# Patient Record
Sex: Male | Born: 1965 | Race: White | Hispanic: No | Marital: Married | Smoking: Never smoker
Health system: Southern US, Community
[De-identification: ages and names within clinical notes are randomized; demographics above are authoritative.]

## PROBLEM LIST (undated history)

## (undated) DIAGNOSIS — K219 Gastro-esophageal reflux disease without esophagitis: Secondary | ICD-10-CM

## (undated) DIAGNOSIS — I10 Essential (primary) hypertension: Secondary | ICD-10-CM

## (undated) DIAGNOSIS — E7800 Pure hypercholesterolemia, unspecified: Secondary | ICD-10-CM

## (undated) HISTORY — PX: SHOULDER SURGERY: SHX246

## (undated) HISTORY — PX: TONSILLECTOMY: SUR1361

---

## 1999-09-04 ENCOUNTER — Ambulatory Visit (HOSPITAL_COMMUNITY): Admission: RE | Admit: 1999-09-04 | Discharge: 1999-09-04 | Payer: Self-pay | Admitting: Emergency Medicine

## 1999-09-04 ENCOUNTER — Encounter: Payer: Self-pay | Admitting: Emergency Medicine

## 1999-09-25 ENCOUNTER — Encounter: Payer: Self-pay | Admitting: Emergency Medicine

## 1999-09-25 ENCOUNTER — Ambulatory Visit (HOSPITAL_COMMUNITY): Admission: RE | Admit: 1999-09-25 | Discharge: 1999-09-25 | Payer: Self-pay | Admitting: Oncology

## 2012-07-22 DIAGNOSIS — K219 Gastro-esophageal reflux disease without esophagitis: Secondary | ICD-10-CM | POA: Diagnosis present

## 2012-07-22 DIAGNOSIS — E119 Type 2 diabetes mellitus without complications: Secondary | ICD-10-CM

## 2013-02-09 ENCOUNTER — Other Ambulatory Visit: Payer: Self-pay | Admitting: Family Medicine

## 2013-02-09 ENCOUNTER — Ambulatory Visit
Admission: RE | Admit: 2013-02-09 | Discharge: 2013-02-09 | Disposition: A | Discharge status: Home / Self Care | Payer: Worker's Compensation | Source: Ambulatory Visit | Attending: Family Medicine | Admitting: Family Medicine

## 2013-02-09 DIAGNOSIS — R52 Pain, unspecified: Secondary | ICD-10-CM

## 2015-02-18 DIAGNOSIS — R7301 Impaired fasting glucose: Secondary | ICD-10-CM | POA: Insufficient documentation

## 2015-02-18 DIAGNOSIS — IMO0002 Reserved for concepts with insufficient information to code with codable children: Secondary | ICD-10-CM | POA: Insufficient documentation

## 2015-02-18 DIAGNOSIS — K2100 Gastro-esophageal reflux disease with esophagitis, without bleeding: Secondary | ICD-10-CM | POA: Diagnosis present

## 2015-02-18 DIAGNOSIS — N182 Chronic kidney disease, stage 2 (mild): Secondary | ICD-10-CM | POA: Insufficient documentation

## 2015-02-18 DIAGNOSIS — I10 Essential (primary) hypertension: Secondary | ICD-10-CM | POA: Diagnosis present

## 2015-02-18 DIAGNOSIS — E782 Mixed hyperlipidemia: Secondary | ICD-10-CM | POA: Insufficient documentation

## 2015-02-18 DIAGNOSIS — R7401 Elevation of levels of liver transaminase levels: Secondary | ICD-10-CM | POA: Insufficient documentation

## 2016-11-25 DIAGNOSIS — K760 Fatty (change of) liver, not elsewhere classified: Secondary | ICD-10-CM | POA: Diagnosis present

## 2018-02-20 DIAGNOSIS — M1711 Unilateral primary osteoarthritis, right knee: Secondary | ICD-10-CM | POA: Insufficient documentation

## 2019-02-27 DIAGNOSIS — E6609 Other obesity due to excess calories: Secondary | ICD-10-CM | POA: Insufficient documentation

## 2019-02-27 DIAGNOSIS — Z6831 Body mass index (BMI) 31.0-31.9, adult: Secondary | ICD-10-CM | POA: Insufficient documentation

## 2020-05-12 ENCOUNTER — Encounter (HOSPITAL_BASED_OUTPATIENT_CLINIC_OR_DEPARTMENT_OTHER): Payer: Self-pay | Admitting: *Deleted

## 2020-05-12 ENCOUNTER — Emergency Department (HOSPITAL_BASED_OUTPATIENT_CLINIC_OR_DEPARTMENT_OTHER): Payer: No Typology Code available for payment source

## 2020-05-12 ENCOUNTER — Inpatient Hospital Stay (HOSPITAL_BASED_OUTPATIENT_CLINIC_OR_DEPARTMENT_OTHER)
Admission: EM | Admit: 2020-05-12 | Discharge: 2020-05-22 | DRG: 501 | Disposition: A | Discharge status: Rehabilitation Facility | Payer: No Typology Code available for payment source | Attending: Orthopedic Surgery | Admitting: Orthopedic Surgery

## 2020-05-12 ENCOUNTER — Other Ambulatory Visit: Payer: Self-pay

## 2020-05-12 DIAGNOSIS — D72829 Elevated white blood cell count, unspecified: Secondary | ICD-10-CM

## 2020-05-12 DIAGNOSIS — E7800 Pure hypercholesterolemia, unspecified: Secondary | ICD-10-CM | POA: Diagnosis present

## 2020-05-12 DIAGNOSIS — S76112A Strain of left quadriceps muscle, fascia and tendon, initial encounter: Secondary | ICD-10-CM | POA: Diagnosis not present

## 2020-05-12 DIAGNOSIS — S76111A Strain of right quadriceps muscle, fascia and tendon, initial encounter: Secondary | ICD-10-CM

## 2020-05-12 DIAGNOSIS — S86819A Strain of other muscle(s) and tendon(s) at lower leg level, unspecified leg, initial encounter: Secondary | ICD-10-CM | POA: Diagnosis present

## 2020-05-12 DIAGNOSIS — Z20822 Contact with and (suspected) exposure to covid-19: Secondary | ICD-10-CM | POA: Diagnosis present

## 2020-05-12 DIAGNOSIS — K2100 Gastro-esophageal reflux disease with esophagitis, without bleeding: Secondary | ICD-10-CM | POA: Diagnosis present

## 2020-05-12 DIAGNOSIS — E871 Hypo-osmolality and hyponatremia: Secondary | ICD-10-CM | POA: Diagnosis not present

## 2020-05-12 DIAGNOSIS — K760 Fatty (change of) liver, not elsewhere classified: Secondary | ICD-10-CM | POA: Diagnosis present

## 2020-05-12 DIAGNOSIS — Z79899 Other long term (current) drug therapy: Secondary | ICD-10-CM

## 2020-05-12 DIAGNOSIS — S86812A Strain of other muscle(s) and tendon(s) at lower leg level, left leg, initial encounter: Secondary | ICD-10-CM

## 2020-05-12 DIAGNOSIS — E119 Type 2 diabetes mellitus without complications: Secondary | ICD-10-CM | POA: Diagnosis present

## 2020-05-12 DIAGNOSIS — M25561 Pain in right knee: Secondary | ICD-10-CM | POA: Diagnosis present

## 2020-05-12 DIAGNOSIS — Z7982 Long term (current) use of aspirin: Secondary | ICD-10-CM

## 2020-05-12 DIAGNOSIS — W19XXXA Unspecified fall, initial encounter: Secondary | ICD-10-CM

## 2020-05-12 DIAGNOSIS — K219 Gastro-esophageal reflux disease without esophagitis: Secondary | ICD-10-CM | POA: Diagnosis present

## 2020-05-12 DIAGNOSIS — I10 Essential (primary) hypertension: Secondary | ICD-10-CM | POA: Diagnosis present

## 2020-05-12 HISTORY — DX: Gastro-esophageal reflux disease without esophagitis: K21.9

## 2020-05-12 HISTORY — DX: Essential (primary) hypertension: I10

## 2020-05-12 HISTORY — DX: Pure hypercholesterolemia, unspecified: E78.00

## 2020-05-12 LAB — BASIC METABOLIC PANEL
Anion gap: 11 (ref 5–15)
BUN: 22 mg/dL — ABNORMAL HIGH (ref 6–20)
CO2: 22 mmol/L (ref 22–32)
Calcium: 8.9 mg/dL (ref 8.9–10.3)
Chloride: 97 mmol/L — ABNORMAL LOW (ref 98–111)
Creatinine, Ser: 0.89 mg/dL (ref 0.61–1.24)
GFR, Estimated: >60 mL/min (ref >60)
Glucose, Bld: 184 mg/dL — ABNORMAL HIGH (ref 70–99)
Potassium: 4 mmol/L (ref 3.5–5.1)
Sodium: 130 mmol/L — ABNORMAL LOW (ref 135–145)

## 2020-05-12 LAB — RESP PANEL BY RT-PCR (FLU A&B, COVID) ARPGX2
Influenza A by PCR: NEGATIVE
Influenza B by PCR: NEGATIVE
SARS Coronavirus 2 by RT PCR: NEGATIVE

## 2020-05-12 LAB — CBC WITH DIFFERENTIAL/PLATELET
Abs Immature Granulocytes: 0.11 10*3/uL — ABNORMAL HIGH (ref 0.00–0.07)
Basophils Absolute: 0.1 10*3/uL (ref 0.0–0.1)
Basophils Relative: 1 %
Eosinophils Absolute: 0.1 10*3/uL (ref 0.0–0.5)
Eosinophils Relative: 1 %
HCT: 42.9 % (ref 39.0–52.0)
Hemoglobin: 14.8 g/dL (ref 13.0–17.0)
Immature Granulocytes: 1 %
Lymphocytes Relative: 15 %
Lymphs Abs: 2.3 10*3/uL (ref 0.7–4.0)
MCH: 30.1 pg (ref 26.0–34.0)
MCHC: 34.5 g/dL (ref 30.0–36.0)
MCV: 87.2 fL (ref 80.0–100.0)
Monocytes Absolute: 0.8 10*3/uL (ref 0.1–1.0)
Monocytes Relative: 5 %
Neutro Abs: 12.1 10*3/uL — ABNORMAL HIGH (ref 1.7–7.7)
Neutrophils Relative %: 77 %
Platelets: 289 10*3/uL (ref 150–400)
RBC: 4.92 MIL/uL (ref 4.22–5.81)
RDW: 12.4 % (ref 11.5–15.5)
WBC: 15.4 10*3/uL — ABNORMAL HIGH (ref 4.0–10.5)
nRBC: 0 % (ref 0.0–0.2)

## 2020-05-12 MED ORDER — PANTOPRAZOLE SODIUM 40 MG PO TBEC: 40.0000 mg | DELAYED_RELEASE_TABLET | Freq: Every day | ORAL | Status: DC

## 2020-05-12 MED ORDER — SIMVASTATIN 20 MG PO TABS
20.0000 mg | ORAL_TABLET | Freq: Every day | ORAL | Status: DC
  Administered 2020-05-14 – 2020-05-21 (×8): 20 mg via ORAL
  Filled 2020-05-12 (×9): qty 1

## 2020-05-12 MED ORDER — IRBESARTAN 150 MG PO TABS
150.0000 mg | ORAL_TABLET | Freq: Every day | ORAL | Status: DC
  Administered 2020-05-14 – 2020-05-22 (×9): 150 mg via ORAL
  Filled 2020-05-12 (×9): qty 1

## 2020-05-12 MED ORDER — HYDROMORPHONE HCL 1 MG/ML IJ SOLN
1.0000 mg | INTRAMUSCULAR | Status: DC | PRN
  Filled 2020-05-12: qty 1

## 2020-05-12 MED ORDER — OXYCODONE-ACETAMINOPHEN 5-325 MG PO TABS
2.0000 | ORAL_TABLET | ORAL | Status: DC | PRN
  Administered 2020-05-12 – 2020-05-13 (×2): 2 via ORAL
  Filled 2020-05-12 (×2): qty 2

## 2020-05-12 MED ORDER — HYDROMORPHONE HCL 1 MG/ML IJ SOLN
1.0000 mg | Freq: Once | INTRAMUSCULAR | Status: AC
  Administered 2020-05-12: 1 mg via INTRAVENOUS
  Filled 2020-05-12: qty 1

## 2020-05-12 NOTE — ED Notes (Signed)
Pt was did not tolerate moving left knee at all, to get into knee immobilizer, even with pain medicine on board. Notified MD and strapped Ice packs to left knee.  Did tolerate immobilizing the right knee.

## 2020-05-12 NOTE — ED Provider Notes (Signed)
[facility] EMERGENCY DEPARTMENT Provider Note   CSN: [id number] Arrival date & time: 05/12/20  1912     History Chief Complaint  Patient presents with  . Knee Pain    Bilat L and R    Arthur Ayers is a 55 y.o. male.  He does not have any significant medical history.  He was at work for [employer] when he slipped getting out of his truck.  Knee buckled and when he fell he hit his right knee under the truck.  Now the both knees are swollen and tender and cannot support him.  He called some coworkers who picked him up and put them in a car and brought him here.  He was not able to ambulate into the department.  Denies any other injuries no loss of consciousness.  He is not on any blood thinners.  No distal numbness or weakness.  The history is provided by the patient.  Knee Pain Location:  Knee Injury: yes   Mechanism of injury: fall   Fall:    Point of impact:  Knees Knee location:  L knee and R knee Pain details:    Quality:  Throbbing   Severity:  Severe   Onset quality:  Sudden   Timing:  Constant   Progression:  Unchanged Chronicity:  New Associated symptoms: swelling   Associated symptoms: no back pain, no fever, no neck pain and no numbness        Past Medical History:  Diagnosis Date  . GERD (gastroesophageal reflux disease)   . Hypercholesteremia   . Hypertension     There are no problems to display for this patient.   Past Surgical History:  Procedure Laterality Date  . SHOULDER SURGERY     Rotator Cuff Repair in 2015 R shoulder  . TONSILLECTOMY         No family history on file.     Home Medications Prior to Admission medications   Medication Sig Start Date End Date Taking? Authorizing Provider  aspirin 325 MG EC tablet Take 325 mg by mouth.   Yes [provider]  Boswellia-Glucosamine-Vit D (OSTEO BI-FLEX-GLUCOS/5-LOXIN) TABS Take 1 tablet by mouth every morning.   Yes [provider]  esomeprazole (NEXIUM) 40  MG capsule Take 40 mg by mouth daily. 02/18/20  Yes [provider]  Multiple Vitamin (MULTIVITAMIN) tablet Take 1 tablet by mouth every morning.   Yes [provider]  simvastatin (ZOCOR) 20 MG tablet  02/27/19  Yes [provider]  telmisartan (MICARDIS) 40 MG tablet  02/27/19  Yes [provider]    Allergies    Patient has no known allergies.  Review of Systems   Review of Systems  Constitutional: Negative for fever.  HENT: Negative for sore throat.   Eyes: Negative for visual disturbance.  Respiratory: Negative for shortness of breath.   Cardiovascular: Negative for chest pain.  Gastrointestinal: Negative for abdominal pain.  Genitourinary: Negative for dysuria.  Musculoskeletal: Positive for gait problem. Negative for back pain and neck pain.  Skin: Negative for rash.  Neurological: Negative for headaches.    Physical Exam Updated Vital Signs BP 132/80 (BP Location: Left Arm)   Pulse 87   Temp 98.7 F (37.1 C) (Oral)   Resp 18   Ht 5\' 9"  (1.753 m)   Wt 99.8 kg   SpO2 98%   BMI 32.49 kg/m   Physical Exam Vitals and nursing note reviewed.  Constitutional:  Appearance: Normal appearance. He is well-developed.  HENT:     Head: Normocephalic and atraumatic.  Eyes:     Conjunctiva/sclera: Conjunctivae normal.  Cardiovascular:     Rate and Rhythm: Normal rate and regular rhythm.     Heart sounds: No murmur heard.   Pulmonary:     Effort: Pulmonary effort is normal. No respiratory distress.     Breath sounds: Normal breath sounds.  Abdominal:     Palpations: Abdomen is soft.     Tenderness: There is no abdominal tenderness.  Musculoskeletal:        General: Swelling, tenderness and signs of injury present.     Cervical back: Neck supple.     Comments: Right knee patella seems normally located, nontender joint line.  He is tender and swollen superior to the kneecap.  His extensor mechanism is minimally intact and he can raise  his heel off the bed about an inch.  Distal neurovascular intact.  Left knee.  Patella seems to be slightly offset laterally and superior.  Joint line nontender.  Unable to raise his heel off the bed to assess extensor mechanism.  Distal neurovascular intact.  Skin:    General: Skin is warm and dry.  Neurological:     General: No focal deficit present.     Mental Status: He is alert.     ED Results / Procedures / Treatments   Labs (all labs ordered are listed, but only abnormal results are displayed) Labs Reviewed  BASIC METABOLIC PANEL - Abnormal; Notable for the following components:      Result Value   Sodium 130 (*)    Chloride 97 (*)    Glucose, Bld 184 (*)    BUN 22 (*)    All other components within normal limits  CBC WITH DIFFERENTIAL/PLATELET - Abnormal; Notable for the following components:   WBC 15.4 (*)    Neutro Abs 12.1 (*)    Abs Immature Granulocytes 0.11 (*)    All other components within normal limits  URINALYSIS, ROUTINE W REFLEX MICROSCOPIC - Abnormal; Notable for the following components:   Ketones, ur 5 (*)    All other components within normal limits  HEMOGLOBIN A1C - Abnormal; Notable for the following components:   Hgb A1c MFr Bld 7.1 (*)    All other components within normal limits  RESP PANEL BY RT-PCR (FLU A&B, COVID) ARPGX2  URINE CULTURE  BASIC METABOLIC PANEL  CBC WITH DIFFERENTIAL/PLATELET    EKG None  Radiology DG Knee 1-2 Views Left  Result Date: 05/12/2020 CLINICAL DATA:  Fall.  Left knee swelling. EXAM: LEFT KNEE - 1-2 VIEW COMPARISON:  None. FINDINGS: There is patellar [place]. There is an osseous fragment displaced 2.8 cm superior to the superior patellar pole that may represent a displaced enthesophyte or patellar fracture pregnant. Large amount of soft tissue edema in the region of the quadriceps tendon most consistent with quadriceps tendon rupture. There is a joint effusion. No fracture of the femur, tibia, or fibula. Tibiofemoral  alignment is normal. IMPRESSION: 1. Findings consistent with quadriceps tendon rupture. 2. Displacement of an osseous fragment 2.8 cm superior to the superior patellar pole, possibly displaced enthesophyte or patellar fracture. 3. Joint effusion. Electronically Signed   By: Narda RutherfordMelanie  Sanford M.D.   On: 05/12/2020 20:17   DG Knee 1-2 Views Right  Result Date: 05/12/2020 CLINICAL DATA:  Right knee pain after fall. EXAM: RIGHT KNEE - 1-2 VIEW COMPARISON:  None. FINDINGS: Mild patellar Baja. Large amount  of soft tissue edema in the suprapatellar region. There is a 6 mm osseous fragment displaced 2.4 cm proximal to the superior patella. Joint effusion. No fracture of the femur, proximal tibia or fibula. Tibiofemoral alignment is maintained. IMPRESSION: 1. Displaced 6 mm osseous fragment adjacent to the superior patella with associated soft tissue edema and mild patellar Baja. Findings are suggestive of quadriceps tendon injury/rupture. 2. Joint effusion. Electronically Signed   By: Narda Rutherford M.D.   On: 05/12/2020 20:24    Procedures Procedures   Medications Ordered in ED Medications  HYDROmorphone (DILAUDID) injection 1 mg (has no administration in time range)  oxyCODONE-acetaminophen (PERCOCET/ROXICET) 5-325 MG per tablet 2 tablet (2 tablets Oral Given 05/13/20 0405)  pantoprazole (PROTONIX) EC tablet 40 mg (40 mg Oral Not Given 05/13/20 1005)  simvastatin (ZOCOR) tablet 20 mg (has no administration in time range)  irbesartan (AVAPRO) tablet 150 mg (150 mg Oral Not Given 05/13/20 1005)  0.9 % NaCl with KCl 20 mEq/ L  infusion ( Intravenous New Bag/Given 05/13/20 0356)  enoxaparin (LOVENOX) injection 40 mg (40 mg Subcutaneous Not Given 05/13/20 1008)  HYDROmorphone (DILAUDID) injection 1 mg (1 mg Intravenous Given 05/12/20 2111)    ED Course  I have reviewed the triage vital signs and the nursing notes.  Pertinent labs & imaging results that were available during my care of the patient were  reviewed by me and considered in my medical decision making (see chart for details).  Clinical Course as of 05/13/20 1008  Mon May 12, 2020  2134 Discussed with Dr. Eulah Pont orthopedics.  He said to try to see if we can mobilize him with bilateral knee immobilizers and a walker but if he is unsafe for discharge to admit him to his service at [employer] [employer].  Order MRI bilateral knee.  Anticipate for surgery on Thursday. [MB]  2134 Patient unable to tolerate getting into the knee immobilizers.  I have put in temporary admission orders to [facility] under Dr. Greig Right service.  I have ordered the MRI and have ordered some pain medicine here along with the patient's home meds. [MB]    Clinical Course User Index [MB] Terrilee Files, MD   MDM Rules/Calculators/A&P                         This patient complains of bilateral knee pain after a fall; this involves an extensive number of treatment Options and is a complaint that carries with it a high risk of complications and Morbidity. The differential includes fracture, dislocation, extensor mechanism disruption, meniscal injury, vascular injury  I ordered, reviewed and interpreted labs, which included CBC with elevated white count likely reactive, normal hemoglobin, chemistries fairly normal other than elevated glucose, Covid testing negative I ordered medication IV pain medication I ordered imaging studies which included x-rays bilateral knees and I independently    visualized and interpreted imaging which showed patellar fractures and likely extensor mechanism disruption Previous records obtained and reviewed in epic, no recent findings I consulted Dr. Eulah Pont orthopedics and discussed lab and imaging findings  Critical Interventions: None  After the interventions stated above, I reevaluated the patient and found patient's pain to be adequately controlled.  He is unable to ambulate safely in the department.  He will be admitted and transferred to [facility]  hospital for further orthopedic evaluation and likely operative repair.  Patient in agreement with plan.   Final Clinical Impression(s) / ED Diagnoses Final diagnoses:  Rupture of  left patellar tendon, initial encounter  Rupture of right quadriceps muscle, initial encounter    Rx / DC Orders ED Discharge Orders    None       Terrilee Files, MD 05/13/20 1013

## 2020-05-12 NOTE — ED Triage Notes (Signed)
Pt. Reports he stepped down out of his [employer] truck the L foot slipped and his L knee popped out of joint per Pt. And the R knee hit the truck beside of him he believes and now it is swollen per Pt. And he can not apply any pressure to the R leg.  He can straighten the R leg but apply no pressure.  Pt. Reports he can not even straighten the L leg.

## 2020-05-13 ENCOUNTER — Inpatient Hospital Stay (HOSPITAL_COMMUNITY): Payer: No Typology Code available for payment source | Admitting: Anesthesiology

## 2020-05-13 ENCOUNTER — Inpatient Hospital Stay (HOSPITAL_COMMUNITY): Payer: No Typology Code available for payment source

## 2020-05-13 ENCOUNTER — Encounter (HOSPITAL_COMMUNITY): Payer: Self-pay | Admitting: Orthopedic Surgery

## 2020-05-13 ENCOUNTER — Encounter (HOSPITAL_COMMUNITY)
Admission: EM | Disposition: A | Discharge status: Rehabilitation Facility | Payer: Self-pay | Source: Home / Self Care | Attending: Orthopedic Surgery

## 2020-05-13 DIAGNOSIS — Z7982 Long term (current) use of aspirin: Secondary | ICD-10-CM | POA: Diagnosis not present

## 2020-05-13 DIAGNOSIS — S76112A Strain of left quadriceps muscle, fascia and tendon, initial encounter: Secondary | ICD-10-CM | POA: Diagnosis present

## 2020-05-13 DIAGNOSIS — K760 Fatty (change of) liver, not elsewhere classified: Secondary | ICD-10-CM | POA: Diagnosis present

## 2020-05-13 DIAGNOSIS — K2100 Gastro-esophageal reflux disease with esophagitis, without bleeding: Secondary | ICD-10-CM | POA: Diagnosis present

## 2020-05-13 DIAGNOSIS — M25561 Pain in right knee: Secondary | ICD-10-CM

## 2020-05-13 DIAGNOSIS — R7401 Elevation of levels of liver transaminase levels: Secondary | ICD-10-CM | POA: Diagnosis not present

## 2020-05-13 DIAGNOSIS — E119 Type 2 diabetes mellitus without complications: Secondary | ICD-10-CM | POA: Diagnosis present

## 2020-05-13 DIAGNOSIS — E871 Hypo-osmolality and hyponatremia: Secondary | ICD-10-CM | POA: Diagnosis not present

## 2020-05-13 DIAGNOSIS — S76112S Strain of left quadriceps muscle, fascia and tendon, sequela: Secondary | ICD-10-CM | POA: Diagnosis not present

## 2020-05-13 DIAGNOSIS — S86819A Strain of other muscle(s) and tendon(s) at lower leg level, unspecified leg, initial encounter: Secondary | ICD-10-CM | POA: Diagnosis present

## 2020-05-13 DIAGNOSIS — S76111A Strain of right quadriceps muscle, fascia and tendon, initial encounter: Secondary | ICD-10-CM | POA: Diagnosis present

## 2020-05-13 DIAGNOSIS — E7800 Pure hypercholesterolemia, unspecified: Secondary | ICD-10-CM | POA: Diagnosis present

## 2020-05-13 DIAGNOSIS — I10 Essential (primary) hypertension: Secondary | ICD-10-CM | POA: Diagnosis present

## 2020-05-13 DIAGNOSIS — W19XXXA Unspecified fall, initial encounter: Secondary | ICD-10-CM | POA: Diagnosis present

## 2020-05-13 DIAGNOSIS — Z79899 Other long term (current) drug therapy: Secondary | ICD-10-CM | POA: Diagnosis not present

## 2020-05-13 DIAGNOSIS — Z20822 Contact with and (suspected) exposure to covid-19: Secondary | ICD-10-CM | POA: Diagnosis present

## 2020-05-13 DIAGNOSIS — D72829 Elevated white blood cell count, unspecified: Secondary | ICD-10-CM | POA: Diagnosis not present

## 2020-05-13 DIAGNOSIS — S86819S Strain of other muscle(s) and tendon(s) at lower leg level, unspecified leg, sequela: Secondary | ICD-10-CM | POA: Diagnosis not present

## 2020-05-13 HISTORY — DX: Pain in right knee: M25.561

## 2020-05-13 HISTORY — PX: QUADRICEPS TENDON REPAIR: SHX756

## 2020-05-13 LAB — BASIC METABOLIC PANEL
Anion gap: 6 (ref 5–15)
BUN: 19 mg/dL (ref 6–20)
CO2: 28 mmol/L (ref 22–32)
Calcium: 8.6 mg/dL — ABNORMAL LOW (ref 8.9–10.3)
Chloride: 99 mmol/L (ref 98–111)
Creatinine, Ser: 1.01 mg/dL (ref 0.61–1.24)
GFR, Estimated: >60 mL/min (ref >60)
Glucose, Bld: 150 mg/dL — ABNORMAL HIGH (ref 70–99)
Potassium: 4 mmol/L (ref 3.5–5.1)
Sodium: 133 mmol/L — ABNORMAL LOW (ref 135–145)

## 2020-05-13 LAB — CBC WITH DIFFERENTIAL/PLATELET
Abs Immature Granulocytes: 0.07 10*3/uL (ref 0.00–0.07)
Basophils Absolute: 0 10*3/uL (ref 0.0–0.1)
Basophils Relative: 0 %
Eosinophils Absolute: 0.1 10*3/uL (ref 0.0–0.5)
Eosinophils Relative: 2 %
HCT: 37.6 % — ABNORMAL LOW (ref 39.0–52.0)
Hemoglobin: 13 g/dL (ref 13.0–17.0)
Immature Granulocytes: 1 %
Lymphocytes Relative: 23 %
Lymphs Abs: 2.2 10*3/uL (ref 0.7–4.0)
MCH: 30.4 pg (ref 26.0–34.0)
MCHC: 34.6 g/dL (ref 30.0–36.0)
MCV: 87.9 fL (ref 80.0–100.0)
Monocytes Absolute: 0.8 10*3/uL (ref 0.1–1.0)
Monocytes Relative: 8 %
Neutro Abs: 6.4 10*3/uL (ref 1.7–7.7)
Neutrophils Relative %: 66 %
Platelets: 248 10*3/uL (ref 150–400)
RBC: 4.28 MIL/uL (ref 4.22–5.81)
RDW: 12.6 % (ref 11.5–15.5)
WBC: 9.6 10*3/uL (ref 4.0–10.5)
nRBC: 0 % (ref 0.0–0.2)

## 2020-05-13 LAB — URINALYSIS, ROUTINE W REFLEX MICROSCOPIC
Bilirubin Urine: NEGATIVE
Glucose, UA: NEGATIVE mg/dL
Hgb urine dipstick: NEGATIVE
Ketones, ur: 5 mg/dL — AB
Leukocytes,Ua: NEGATIVE
Nitrite: NEGATIVE
Protein, ur: NEGATIVE mg/dL
Specific Gravity, Urine: 1.025 (ref 1.005–1.030)
pH: 5 (ref 5.0–8.0)

## 2020-05-13 LAB — SURGICAL PCR SCREEN
MRSA, PCR: NEGATIVE
Staphylococcus aureus: POSITIVE — AB

## 2020-05-13 LAB — GLUCOSE, CAPILLARY
Glucose-Capillary: 181 mg/dL — ABNORMAL HIGH (ref 70–99)
Glucose-Capillary: 196 mg/dL — ABNORMAL HIGH (ref 70–99)

## 2020-05-13 LAB — HEMOGLOBIN A1C
Hgb A1c MFr Bld: 7.1 % — ABNORMAL HIGH (ref 4.8–5.6)
Mean Plasma Glucose: 157.07 mg/dL

## 2020-05-13 SURGERY — REPAIR, TENDON, QUADRICEPS
Anesthesia: General | Site: Knee | Laterality: Bilateral

## 2020-05-13 MED ORDER — METHOCARBAMOL 1000 MG/10ML IJ SOLN
500.0000 mg | Freq: Four times a day (QID) | INTRAVENOUS | Status: DC | PRN
  Filled 2020-05-13: qty 5

## 2020-05-13 MED ORDER — MIDAZOLAM HCL 2 MG/2ML IJ SOLN: 2.0000 mg | Freq: Once | INTRAMUSCULAR | Status: AC

## 2020-05-13 MED ORDER — DOCUSATE SODIUM 100 MG PO CAPS
100.0000 mg | ORAL_CAPSULE | Freq: Two times a day (BID) | ORAL | Status: DC
  Administered 2020-05-13 – 2020-05-22 (×18): 100 mg via ORAL
  Filled 2020-05-13 (×18): qty 1

## 2020-05-13 MED ORDER — TRANEXAMIC ACID-NACL 1000-0.7 MG/100ML-% IV SOLN
INTRAVENOUS | Status: AC
  Filled 2020-05-13: qty 100

## 2020-05-13 MED ORDER — LACTATED RINGERS IV SOLN: INTRAVENOUS | Status: DC | PRN

## 2020-05-13 MED ORDER — LIDOCAINE 2% (20 MG/ML) 5 ML SYRINGE
INTRAMUSCULAR | Status: DC | PRN
  Administered 2020-05-13: 100 mg via INTRAVENOUS

## 2020-05-13 MED ORDER — PROPOFOL 10 MG/ML IV BOLUS
INTRAVENOUS | Status: DC | PRN
  Administered 2020-05-13: 200 mg via INTRAVENOUS

## 2020-05-13 MED ORDER — ONDANSETRON HCL 4 MG/2ML IJ SOLN
INTRAMUSCULAR | Status: AC
  Filled 2020-05-13: qty 2

## 2020-05-13 MED ORDER — TRAMADOL HCL 50 MG PO TABS
50.0000 mg | ORAL_TABLET | Freq: Four times a day (QID) | ORAL | Status: DC
Start: 2020-05-13 — End: 2020-05-22
  Administered 2020-05-14 – 2020-05-22 (×34): 50 mg via ORAL
  Filled 2020-05-13 (×35): qty 1

## 2020-05-13 MED ORDER — ONDANSETRON HCL 4 MG/2ML IJ SOLN
INTRAMUSCULAR | Status: DC | PRN
  Administered 2020-05-13: 4 mg via INTRAVENOUS

## 2020-05-13 MED ORDER — DEXAMETHASONE SODIUM PHOSPHATE 10 MG/ML IJ SOLN
INTRAMUSCULAR | Status: AC
  Filled 2020-05-13: qty 1

## 2020-05-13 MED ORDER — FENTANYL CITRATE (PF) 250 MCG/5ML IJ SOLN
INTRAMUSCULAR | Status: AC
  Filled 2020-05-13: qty 5

## 2020-05-13 MED ORDER — GABAPENTIN 300 MG PO CAPS: 300.0000 mg | ORAL_CAPSULE | Freq: Once | ORAL | Status: AC

## 2020-05-13 MED ORDER — CEFAZOLIN SODIUM-DEXTROSE 2-4 GM/100ML-% IV SOLN
2.0000 g | Freq: Four times a day (QID) | INTRAVENOUS | Status: AC
  Administered 2020-05-13 – 2020-05-14 (×3): 2 g via INTRAVENOUS
  Filled 2020-05-13 (×3): qty 100

## 2020-05-13 MED ORDER — DEXAMETHASONE SODIUM PHOSPHATE 4 MG/ML IJ SOLN
INTRAMUSCULAR | Status: DC | PRN
  Administered 2020-05-13 (×2): 3 mg via INTRAVENOUS

## 2020-05-13 MED ORDER — CEFAZOLIN SODIUM-DEXTROSE 2-4 GM/100ML-% IV SOLN
2.0000 g | INTRAVENOUS | Status: AC
  Administered 2020-05-13: 2 g via INTRAVENOUS

## 2020-05-13 MED ORDER — CLONIDINE HCL (ANALGESIA) 100 MCG/ML EP SOLN
EPIDURAL | Status: DC | PRN
  Administered 2020-05-13 (×2): 50 ug

## 2020-05-13 MED ORDER — CHLORHEXIDINE GLUCONATE CLOTH 2 % EX PADS
6.0000 | MEDICATED_PAD | Freq: Every day | CUTANEOUS | Status: AC
  Administered 2020-05-14 – 2020-05-17 (×4): 6 via TOPICAL

## 2020-05-13 MED ORDER — ACETAMINOPHEN 325 MG PO TABS
325.0000 mg | ORAL_TABLET | Freq: Four times a day (QID) | ORAL | Status: DC | PRN
Start: 2020-05-14 — End: 2020-05-22
  Administered 2020-05-15 – 2020-05-17 (×2): 650 mg via ORAL
  Filled 2020-05-13 (×2): qty 2

## 2020-05-13 MED ORDER — DEXAMETHASONE SODIUM PHOSPHATE 10 MG/ML IJ SOLN
8.0000 mg | Freq: Once | INTRAMUSCULAR | Status: AC
  Administered 2020-05-13: 5 mg via INTRAVENOUS

## 2020-05-13 MED ORDER — CEFAZOLIN SODIUM-DEXTROSE 2-4 GM/100ML-% IV SOLN
INTRAVENOUS | Status: AC
  Filled 2020-05-13: qty 100

## 2020-05-13 MED ORDER — ACETAMINOPHEN 500 MG PO TABS: 1000.0000 mg | ORAL_TABLET | Freq: Four times a day (QID) | ORAL | Status: DC

## 2020-05-13 MED ORDER — CHLORHEXIDINE GLUCONATE 0.12 % MT SOLN
OROMUCOSAL | Status: AC
  Administered 2020-05-13: 15 mL via OROMUCOSAL
  Filled 2020-05-13: qty 15

## 2020-05-13 MED ORDER — DIPHENHYDRAMINE HCL 12.5 MG/5ML PO ELIX
12.5000 mg | ORAL_SOLUTION | ORAL | Status: DC | PRN
  Administered 2020-05-13 – 2020-05-18 (×7): 25 mg via ORAL
  Filled 2020-05-13 (×7): qty 10

## 2020-05-13 MED ORDER — ACETAMINOPHEN 500 MG PO TABS
ORAL_TABLET | ORAL | Status: AC
  Administered 2020-05-13: 1000 mg via ORAL
  Filled 2020-05-13: qty 2

## 2020-05-13 MED ORDER — SODIUM CHLORIDE 0.9 % IV SOLN: INTRAVENOUS | Status: DC

## 2020-05-13 MED ORDER — OXYCODONE HCL 5 MG PO TABS
10.0000 mg | ORAL_TABLET | ORAL | Status: DC | PRN
  Administered 2020-05-14 – 2020-05-15 (×4): 10 mg via ORAL
  Administered 2020-05-15 (×2): 15 mg via ORAL
  Administered 2020-05-15 (×2): 10 mg via ORAL
  Administered 2020-05-16 – 2020-05-17 (×2): 15 mg via ORAL
  Administered 2020-05-17: 10 mg via ORAL
  Administered 2020-05-20: 15 mg via ORAL
  Filled 2020-05-13: qty 3
  Filled 2020-05-13: qty 2
  Filled 2020-05-13: qty 3
  Filled 2020-05-13 (×4): qty 2
  Filled 2020-05-13: qty 3
  Filled 2020-05-13 (×3): qty 2
  Filled 2020-05-13 (×2): qty 3

## 2020-05-13 MED ORDER — METHOCARBAMOL 500 MG PO TABS
500.0000 mg | ORAL_TABLET | Freq: Four times a day (QID) | ORAL | Status: DC | PRN
  Administered 2020-05-14 – 2020-05-21 (×13): 500 mg via ORAL
  Filled 2020-05-13 (×13): qty 1

## 2020-05-13 MED ORDER — BUPIVACAINE-EPINEPHRINE (PF) 0.5% -1:200000 IJ SOLN
INTRAMUSCULAR | Status: DC | PRN
  Administered 2020-05-13 (×2): 20 mL via PERINEURAL

## 2020-05-13 MED ORDER — OXYCODONE HCL 5 MG PO TABS
5.0000 mg | ORAL_TABLET | ORAL | Status: DC | PRN
  Administered 2020-05-13 – 2020-05-18 (×6): 10 mg via ORAL
  Filled 2020-05-13 (×6): qty 2

## 2020-05-13 MED ORDER — METOCLOPRAMIDE HCL 5 MG PO TABS: 5.0000 mg | ORAL_TABLET | Freq: Three times a day (TID) | ORAL | Status: DC | PRN

## 2020-05-13 MED ORDER — MIDAZOLAM HCL 2 MG/2ML IJ SOLN
INTRAMUSCULAR | Status: AC
  Filled 2020-05-13: qty 2

## 2020-05-13 MED ORDER — MEPERIDINE HCL 25 MG/ML IJ SOLN: 6.2500 mg | INTRAMUSCULAR | Status: DC | PRN

## 2020-05-13 MED ORDER — PROPOFOL 10 MG/ML IV BOLUS
INTRAVENOUS | Status: AC
  Filled 2020-05-13: qty 20

## 2020-05-13 MED ORDER — HYDROMORPHONE HCL 1 MG/ML IJ SOLN
0.5000 mg | INTRAMUSCULAR | Status: DC | PRN
  Administered 2020-05-14: 1 mg via INTRAVENOUS
  Filled 2020-05-13: qty 1

## 2020-05-13 MED ORDER — MAGNESIUM CITRATE PO SOLN: 1.0000 | Freq: Once | ORAL | Status: DC | PRN

## 2020-05-13 MED ORDER — MUPIROCIN 2 % EX OINT
1.0000 "application " | TOPICAL_OINTMENT | Freq: Two times a day (BID) | CUTANEOUS | Status: AC
  Administered 2020-05-13 – 2020-05-18 (×10): 1 via NASAL
  Filled 2020-05-13: qty 22

## 2020-05-13 MED ORDER — METOCLOPRAMIDE HCL 5 MG/ML IJ SOLN
5.0000 mg | Freq: Three times a day (TID) | INTRAMUSCULAR | Status: DC | PRN
Start: 2020-05-13 — End: 2020-05-22

## 2020-05-13 MED ORDER — ENOXAPARIN SODIUM 40 MG/0.4ML ~~LOC~~ SOLN: 40.0000 mg | Freq: Every day | SUBCUTANEOUS | Status: DC

## 2020-05-13 MED ORDER — POLYETHYLENE GLYCOL 3350 17 G PO PACK
17.0000 g | PACK | Freq: Every day | ORAL | Status: DC | PRN
  Administered 2020-05-16: 17 g via ORAL
  Filled 2020-05-13: qty 1

## 2020-05-13 MED ORDER — BISACODYL 10 MG RE SUPP: 10.0000 mg | Freq: Every day | RECTAL | Status: DC | PRN

## 2020-05-13 MED ORDER — CHLORHEXIDINE GLUCONATE 0.12 % MT SOLN
15.0000 mL | Freq: Once | OROMUCOSAL | Status: AC
  Administered 2020-05-13: 15 mL via OROMUCOSAL
  Filled 2020-05-13: qty 15

## 2020-05-13 MED ORDER — POTASSIUM CHLORIDE IN NACL 20-0.9 MEQ/L-% IV SOLN
INTRAVENOUS | Status: DC
  Filled 2020-05-13 (×12): qty 1000

## 2020-05-13 MED ORDER — PANTOPRAZOLE SODIUM 40 MG PO TBEC
40.0000 mg | DELAYED_RELEASE_TABLET | Freq: Every day | ORAL | Status: DC
  Administered 2020-05-14 – 2020-05-22 (×9): 40 mg via ORAL
  Filled 2020-05-13 (×9): qty 1

## 2020-05-13 MED ORDER — HYDROMORPHONE HCL 1 MG/ML IJ SOLN: 0.2500 mg | INTRAMUSCULAR | Status: DC | PRN

## 2020-05-13 MED ORDER — DROPERIDOL 2.5 MG/ML IJ SOLN: 0.6250 mg | Freq: Once | INTRAMUSCULAR | Status: DC | PRN

## 2020-05-13 MED ORDER — ENOXAPARIN SODIUM 40 MG/0.4ML ~~LOC~~ SOLN
40.0000 mg | SUBCUTANEOUS | Status: DC
  Administered 2020-05-14 – 2020-05-17 (×4): 40 mg via SUBCUTANEOUS
  Filled 2020-05-13 (×4): qty 0.4

## 2020-05-13 MED ORDER — FENTANYL CITRATE (PF) 100 MCG/2ML IJ SOLN: 100.0000 ug | Freq: Once | INTRAMUSCULAR | Status: AC

## 2020-05-13 MED ORDER — FENTANYL CITRATE (PF) 100 MCG/2ML IJ SOLN
INTRAMUSCULAR | Status: AC
  Administered 2020-05-13: 100 ug via INTRAVENOUS
  Filled 2020-05-13: qty 2

## 2020-05-13 MED ORDER — GABAPENTIN 300 MG PO CAPS
ORAL_CAPSULE | ORAL | Status: AC
  Administered 2020-05-13: 300 mg via ORAL
  Filled 2020-05-13: qty 1

## 2020-05-13 MED ORDER — MIDAZOLAM HCL 2 MG/2ML IJ SOLN
INTRAMUSCULAR | Status: AC
  Administered 2020-05-13: 2 mg via INTRAVENOUS
  Filled 2020-05-13: qty 2

## 2020-05-13 MED ORDER — LIDOCAINE 2% (20 MG/ML) 5 ML SYRINGE
INTRAMUSCULAR | Status: AC
  Filled 2020-05-13: qty 5

## 2020-05-13 MED ORDER — 0.9 % SODIUM CHLORIDE (POUR BTL) OPTIME
TOPICAL | Status: DC | PRN
  Administered 2020-05-13: 1000 mL

## 2020-05-13 MED ORDER — STERILE WATER FOR IRRIGATION IR SOLN
Status: DC | PRN
  Administered 2020-05-13: 1000 mL

## 2020-05-13 MED ORDER — ACETAMINOPHEN 500 MG PO TABS: 1000.0000 mg | ORAL_TABLET | Freq: Once | ORAL | Status: AC

## 2020-05-13 MED ORDER — ONDANSETRON HCL 4 MG PO TABS: 4.0000 mg | ORAL_TABLET | Freq: Four times a day (QID) | ORAL | Status: DC | PRN

## 2020-05-13 MED ORDER — TRANEXAMIC ACID-NACL 1000-0.7 MG/100ML-% IV SOLN
1000.0000 mg | INTRAVENOUS | Status: AC
  Administered 2020-05-13: 1000 mg via INTRAVENOUS

## 2020-05-13 MED ORDER — ONDANSETRON HCL 4 MG/2ML IJ SOLN: 4.0000 mg | Freq: Four times a day (QID) | INTRAMUSCULAR | Status: DC | PRN

## 2020-05-13 MED ORDER — FENTANYL CITRATE (PF) 250 MCG/5ML IJ SOLN
INTRAMUSCULAR | Status: DC | PRN
  Administered 2020-05-13 (×4): 25 ug via INTRAVENOUS

## 2020-05-13 SURGICAL SUPPLY — 74 items
APL PRP STRL LF DISP 70% ISPRP (MISCELLANEOUS) ×1
BAG DECANTER FOR FLEXI CONT (MISCELLANEOUS) ×2 IMPLANT
BANDAGE ESMARK 6X9 LF (GAUZE/BANDAGES/DRESSINGS) ×1 IMPLANT
BLADE SURG 10 STRL SS (BLADE) ×2 IMPLANT
BNDG CMPR 9X6 STRL LF SNTH (GAUZE/BANDAGES/DRESSINGS) ×1
BNDG CMPR MED 15X6 ELC VLCR LF (GAUZE/BANDAGES/DRESSINGS) ×2
BNDG COHESIVE 6X5 TAN STRL LF (GAUZE/BANDAGES/DRESSINGS) ×2 IMPLANT
BNDG ELASTIC 6X15 VLCR STRL LF (GAUZE/BANDAGES/DRESSINGS) ×4 IMPLANT
BNDG ELASTIC 6X5.8 VLCR STR LF (GAUZE/BANDAGES/DRESSINGS) ×2 IMPLANT
BNDG ESMARK 6X9 LF (GAUZE/BANDAGES/DRESSINGS) ×2
CANISTER SUCT 3000ML PPV (MISCELLANEOUS) ×2 IMPLANT
CHLORAPREP W/TINT 26 (MISCELLANEOUS) ×2 IMPLANT
CLSR STERI-STRIP ANTIMIC 1/2X4 (GAUZE/BANDAGES/DRESSINGS) ×4 IMPLANT
COVER SURGICAL LIGHT HANDLE (MISCELLANEOUS) ×2 IMPLANT
COVER WAND RF STERILE (DRAPES) IMPLANT
CUFF TOURN SGL QUICK 34 (TOURNIQUET CUFF) ×8
CUFF TRNQT CYL 34X4.125X (TOURNIQUET CUFF) ×2 IMPLANT
CUFF TRNQT CYL 34X4X40X1 (TOURNIQUET CUFF) ×2 IMPLANT
DECANTER SPIKE VIAL GLASS SM (MISCELLANEOUS) IMPLANT
DRAPE OEC MINIVIEW 54X84 (DRAPES) IMPLANT
DRAPE U-SHAPE 47X51 STRL (DRAPES) ×2 IMPLANT
DRSG ADAPTIC 3X8 NADH LF (GAUZE/BANDAGES/DRESSINGS) ×4 IMPLANT
ELECT REM PT RETURN 9FT ADLT (ELECTROSURGICAL) ×2
ELECTRODE REM PT RTRN 9FT ADLT (ELECTROSURGICAL) ×1 IMPLANT
GAUZE SPONGE 4X4 12PLY STRL (GAUZE/BANDAGES/DRESSINGS) ×2 IMPLANT
GAUZE SPONGE 4X4 12PLY STRL LF (GAUZE/BANDAGES/DRESSINGS) ×4 IMPLANT
GAUZE XEROFORM 1X8 LF (GAUZE/BANDAGES/DRESSINGS) ×2 IMPLANT
GLOVE BIO SURGEON STRL SZ7.5 (GLOVE) ×2 IMPLANT
GLOVE BIOGEL PI IND STRL 7.5 (GLOVE) ×1 IMPLANT
GLOVE BIOGEL PI INDICATOR 7.5 (GLOVE) ×1
GLOVE SRG 8 PF TXTR STRL LF DI (GLOVE) ×1 IMPLANT
GLOVE SURG SYN 7.5  E (GLOVE) ×2
GLOVE SURG SYN 7.5 E (GLOVE) ×1 IMPLANT
GLOVE SURG UNDER POLY LF SZ8 (GLOVE) ×2
GOWN STRL REUS W/ TWL LRG LVL3 (GOWN DISPOSABLE) ×3 IMPLANT
GOWN STRL REUS W/ TWL XL LVL3 (GOWN DISPOSABLE) ×2 IMPLANT
GOWN STRL REUS W/TWL LRG LVL3 (GOWN DISPOSABLE) ×6
GOWN STRL REUS W/TWL XL LVL3 (GOWN DISPOSABLE) ×4
IMMOBILIZER KNEE 22 UNIV (SOFTGOODS) ×2 IMPLANT
IMMOBILIZER KNEE 24 THIGH 36 (MISCELLANEOUS) ×2 IMPLANT
IMMOBILIZER KNEE 24 UNIV (MISCELLANEOUS) ×4
KIT BASIN OR (CUSTOM PROCEDURE TRAY) ×2 IMPLANT
KIT TURNOVER KIT B (KITS) ×2 IMPLANT
NDL SUT 6 .5 CRC .975X.05 MAYO (NEEDLE) IMPLANT
NEEDLE MAYO TAPER (NEEDLE)
NS IRRIG 1000ML POUR BTL (IV SOLUTION) ×2 IMPLANT
PACK ORTHO EXTREMITY (CUSTOM PROCEDURE TRAY) ×2 IMPLANT
PAD ABD 8X10 STRL (GAUZE/BANDAGES/DRESSINGS) ×4 IMPLANT
PAD ARMBOARD 7.5X6 YLW CONV (MISCELLANEOUS) ×4 IMPLANT
PADDING CAST ABS 6INX4YD NS (CAST SUPPLIES) ×1
PADDING CAST ABS COTTON 6X4 NS (CAST SUPPLIES) ×1 IMPLANT
PADDING CAST COTTON 6X4 STRL (CAST SUPPLIES) ×2 IMPLANT
PENCIL BUTTON HOLSTER BLD 10FT (ELECTRODE) ×2 IMPLANT
RETRIEVER SUT HEWSON (MISCELLANEOUS) ×2 IMPLANT
SPONGE LAP 18X18 RF (DISPOSABLE) ×2 IMPLANT
STOCKINETTE IMPERVIOUS LG (DRAPES) ×2 IMPLANT
SUCTION FRAZIER HANDLE 10FR (MISCELLANEOUS)
SUCTION TUBE FRAZIER 10FR DISP (MISCELLANEOUS) IMPLANT
SUT ETHILON 3 0 PS 1 (SUTURE) IMPLANT
SUT FIBERWIRE #2 38 REV NDL BL (SUTURE) ×8
SUT FIBERWIRE #2 38 T-5 BLUE (SUTURE)
SUT MNCRL AB 4-0 PS2 18 (SUTURE) ×4 IMPLANT
SUT VIC AB 0 CT1 27 (SUTURE) ×12
SUT VIC AB 0 CT1 27XBRD ANBCTR (SUTURE) ×6 IMPLANT
SUT VIC AB 2-0 CT1 27 (SUTURE) ×4
SUT VIC AB 2-0 CT1 TAPERPNT 27 (SUTURE) ×2 IMPLANT
SUT VIC AB 2-0 SH 27 (SUTURE) ×4
SUT VIC AB 2-0 SH 27XBRD (SUTURE) ×2 IMPLANT
SUT VIC AB 3-0 SH 27 (SUTURE)
SUT VIC AB 3-0 SH 27X BRD (SUTURE) IMPLANT
SUTURE FIBERWR #2 38 T-5 BLUE (SUTURE) IMPLANT
SUTURE FIBERWR#2 38 REV NDL BL (SUTURE) ×4 IMPLANT
TUBE CONNECTING 20X1/4 (TUBING) ×2 IMPLANT
YANKAUER SUCT BULB TIP NO VENT (SUCTIONS) ×2 IMPLANT

## 2020-05-13 NOTE — Plan of Care (Signed)
  Problem: Health Behavior/Discharge Planning: Goal: Ability to manage health-related needs will improve Outcome: Progressing   Problem: Clinical Measurements: Goal: Ability to maintain clinical measurements within normal limits will improve Outcome: Progressing Goal: Will remain free from infection Outcome: Progressing   

## 2020-05-13 NOTE — Transfer of Care (Signed)
Immediate Anesthesia Transfer of Care Note  Patient: Arthur Ayers  Procedure(s) Performed: REPAIR QUADRICEP TENDON (Bilateral Knee)  Patient Location: PACU  Anesthesia Type:General and Regional  Level of Consciousness: drowsy  Airway & Oxygen Therapy: Patient Spontanous Breathing and Patient connected to face mask oxygen  Post-op Assessment: Report given to RN and Post -op Vital signs reviewed and stable  Post vital signs: Reviewed  Last Vitals:  Vitals Value Taken Time  BP 138/88 05/13/20 1720  Temp 37.3 C 05/13/20 1720  Pulse 93 05/13/20 1725  Resp 14 05/13/20 1725  SpO2 98 % 05/13/20 1725  Vitals shown include unvalidated device data.  Last Pain:  Vitals:   05/13/20 1720  TempSrc:   PainSc: Asleep         Complications: No complications documented.

## 2020-05-13 NOTE — Interval H&P Note (Signed)
History and Physical Interval Note:  05/13/2020 12:38 PM  Arthur Ayers  has presented today for surgery, with the diagnosis of rupture left patellar tendon.  The various methods of treatment have been discussed with the patient and family. After consideration of risks, benefits and other options for treatment, the patient has consented to  Procedure(s): REPAIR QUADRICEP TENDON (Bilateral) as a surgical intervention.  The patient's history has been reviewed, patient examined, no change in status, stable for surgery.  I have reviewed the patient's chart and labs.  Questions were answered to the patient's satisfaction.     Sheral Apley

## 2020-05-13 NOTE — Progress Notes (Addendum)
0025 RN paged Murphy,Timothy,MD to notify of pt arrival on the unit. Awaiting response.  0300 Orders received.  0530 Pt transported to MRI. Pt denies pain. Pt stable.

## 2020-05-13 NOTE — Anesthesia Procedure Notes (Signed)
Procedure Name: LMA Insertion Date/Time: 05/13/2020 2:52 PM Performed by: Audie Pinto, CRNA Pre-anesthesia Checklist: Patient identified, Emergency Drugs available, Suction available and Patient being monitored Patient Re-evaluated:Patient Re-evaluated prior to induction Oxygen Delivery Method: Circle system utilized Preoxygenation: Pre-oxygenation with 100% oxygen Induction Type: IV induction LMA: LMA inserted LMA Size: 5.0 Placement Confirmation: positive ETCO2 Dental Injury: Teeth and Oropharynx as per pre-operative assessment

## 2020-05-13 NOTE — Anesthesia Procedure Notes (Addendum)
Anesthesia Regional Block: Femoral nerve block   Pre-Anesthetic Checklist: ,, timeout performed, Correct Patient, Correct Site, Correct Laterality, Correct Procedure, Correct Position, site marked, Risks and benefits discussed,  Surgical consent,  Pre-op evaluation,  At surgeon's request and post-op pain management  Laterality: Left and Right  Prep: chloraprep       Needles:  Injection technique: Single-shot  Needle Type: Stimulator Needle - 80     Needle Length: 9cm  Needle Gauge: 22   Needle insertion depth: 6 cm   Additional Needles:   Procedures:, nerve stimulator,,, ultrasound used (permanent image in chart),,,,   Nerve Stimulator or Paresthesia:  Response: Patellar snap, 0.5 mA,   Additional Responses:   Narrative:  Start time: 05/13/2020 1:02 PM End time: 05/13/2020 1:27 PM Injection made incrementally with aspirations every 5 mL.  Performed by: Personally  Anesthesiologist: Lewie Loron, MD  Additional Notes: BP cuff, EKG monitors applied. Sedation begun. Femoral artery palpated for location of nerve. After nerve location verified with U/S, anesthetic injected incrementally, slowly, and after negative aspirations under direct u/s guidance. Good perineural spread. Patient tolerated well.

## 2020-05-13 NOTE — Anesthesia Preprocedure Evaluation (Addendum)
Anesthesia Evaluation  Patient identified by MRN, date of birth, ID band Patient awake    Reviewed: Allergy & Precautions, NPO status , Patient's Chart, lab work & pertinent test results  Airway Mallampati: II  TM Distance: >3 FB Neck ROM: Full    Dental  (+) Dental Advisory Given, Teeth Intact   Pulmonary neg pulmonary ROS,    Pulmonary exam normal breath sounds clear to auscultation       Cardiovascular hypertension, negative cardio ROS Normal cardiovascular exam Rhythm:Regular Rate:Normal     Neuro/Psych negative neurological ROS     GI/Hepatic Neg liver ROS, GERD  ,  Endo/Other  diabetes  Renal/GU Renal disease     Musculoskeletal  (+) Arthritis ,   Abdominal   Peds  Hematology negative hematology ROS (+)   Anesthesia Other Findings   Reproductive/Obstetrics                            Anesthesia Physical Anesthesia Plan  ASA: III  Anesthesia Plan: General   Post-op Pain Management: GA combined w/ Regional for post-op pain   Induction: Intravenous  PONV Risk Score and Plan: 3 and Ondansetron, Treatment may vary due to age or medical condition, Midazolam, Diphenhydramine and Dexamethasone  Airway Management Planned: LMA  Additional Equipment: None  Intra-op Plan:   Post-operative Plan: Extubation in OR  Informed Consent: I have reviewed the patients History and Physical, chart, labs and discussed the procedure including the risks, benefits and alternatives for the proposed anesthesia with the patient or authorized representative who has indicated his/her understanding and acceptance.     Dental advisory given  Plan Discussed with: CRNA  Anesthesia Plan Comments:       Anesthesia Quick Evaluation

## 2020-05-13 NOTE — H&P (Addendum)
Arthur Ayers is an 55 y.o. male.   Chief Complaint: unable to Walk HPI: 55 yo male injured both knees getting out of [employer] truck today   Unable to stand or bear weight  Patient is unable to lift his left leg of the bed at all and can barely lift the right leg off the bed  Review of the xrays of the left knee show obvious quadraceps tendon avulsion fracture off the patella with significant displacement of the patella fracture fragments.  Review of xrays of the right knee show significant swelling but no obvious fracture  MRI of both knees have been ordered  Review of labs show significant leukocytosis with decreased renal function and elevated blood glucose    I have ordered a chest xray, ua and culture, HgbA1c, and started IV fluids  Past Medical History:  Diagnosis Date  . Acute pain of right knee 05/13/2020  . GERD (gastroesophageal reflux disease)   . Hypercholesteremia   . Hypertension     Past Surgical History:  Procedure Laterality Date  . SHOULDER SURGERY     Rotator Cuff Repair in 2015 R shoulder  . TONSILLECTOMY      No family history on file. Social History:  reports that he has never smoked. He has never used smokeless tobacco. He reports current alcohol use. He reports that he does not use drugs.  Allergies: No Known Allergies  Medications Prior to Admission  Medication Sig Dispense Refill  . aspirin 325 MG EC tablet Take 325 mg by mouth.    . Boswellia-Glucosamine-Vit D (OSTEO BI-FLEX-GLUCOS/5-LOXIN) TABS Take 1 tablet by mouth every morning.    Marland Kitchen esomeprazole (NEXIUM) 40 MG capsule Take 40 mg by mouth daily.    . Multiple Vitamin (MULTIVITAMIN) tablet Take 1 tablet by mouth every morning.    . simvastatin (ZOCOR) 20 MG tablet     . telmisartan (MICARDIS) 40 MG tablet       Results for orders placed or performed during the hospital encounter of 05/12/20 (from the past 48 hour(s))  Basic metabolic panel     Status: Abnormal   Collection Time: 05/12/20  9:03 PM   Result Value Ref Range   Sodium 130 (L) 135 - 145 mmol/L   Potassium 4.0 3.5 - 5.1 mmol/L   Chloride 97 (L) 98 - 111 mmol/L   CO2 22 22 - 32 mmol/L   Glucose, Bld 184 (H) 70 - 99 mg/dL    Comment: Glucose reference range applies only to samples taken after fasting for at least 8 hours.   BUN 22 (H) 6 - 20 mg/dL   Creatinine, Ser 5.95 0.61 - 1.24 mg/dL   Calcium 8.9 8.9 - 63.8 mg/dL   GFR, Estimated >75 >64 mL/min    Comment: (NOTE) Calculated using the CKD-EPI Creatinine Equation (2021)    Anion gap 11 5 - 15    Comment: Performed at [facility], [address] [place], [place] [address]  CBC with Differential     Status: Abnormal   Collection Time: 05/12/20  9:03 PM  Result Value Ref Range   WBC 15.4 (H) 4.0 - 10.5 K/uL   RBC 4.92 4.22 - 5.81 MIL/uL   Hemoglobin 14.8 13.0 - 17.0 g/dL   HCT 18.8 41.6 - 60.6 %   MCV 87.2 80.0 - 100.0 fL   MCH 30.1 26.0 - 34.0 pg   MCHC 34.5 30.0 - 36.0 g/dL   RDW 30.1 60.1 - 09.3 %  Platelets 289 150 - 400 K/uL   nRBC 0.0 0.0 - 0.2 %   Neutrophils Relative % 77 %   Neutro Abs 12.1 (H) 1.7 - 7.7 K/uL   Lymphocytes Relative 15 %   Lymphs Abs 2.3 0.7 - 4.0 K/uL   Monocytes Relative 5 %   Monocytes Absolute 0.8 0.1 - 1.0 K/uL   Eosinophils Relative 1 %   Eosinophils Absolute 0.1 0.0 - 0.5 K/uL   Basophils Relative 1 %   Basophils Absolute 0.1 0.0 - 0.1 K/uL   Immature Granulocytes 1 %   Abs Immature Granulocytes 0.11 (H) 0.00 - 0.07 K/uL    Comment: Performed at [facility], [address] [place], [place] [address]  Resp Panel by RT-PCR (Flu A&B, Covid) Nasopharyngeal Swab     Status: None   Collection Time: 05/12/20  9:46 PM   Specimen: Nasopharyngeal Swab; Nasopharyngeal(NP) swabs in vial transport medium  Result Value Ref Range   SARS Coronavirus 2 by RT PCR NEGATIVE NEGATIVE    Comment: (NOTE) SARS-CoV-2 target nucleic acids are NOT DETECTED.  The SARS-CoV-2 RNA is generally detectable in upper  respiratory specimens during the acute phase of infection. The lowest concentration of SARS-CoV-2 viral copies this assay can detect is 138 copies/mL. A negative result does not preclude SARS-Cov-2 infection and should not be used as the sole basis for treatment or other patient management decisions. A negative result may occur with  improper specimen collection/handling, submission of specimen other than nasopharyngeal swab, presence of viral mutation(s) within the areas targeted by this assay, and inadequate number of viral copies(<138 copies/mL). A negative result must be combined with clinical observations, patient history, and epidemiological information. The expected result is Negative.  Fact Sheet for Patients:  BloggerCourse.com  Fact Sheet for Healthcare Providers:  SeriousBroker.it  This test is no t yet approved or cleared by the [place] FDA and  has been authorized for detection and/or diagnosis of SARS-CoV-2 by FDA under an Emergency Use Authorization (EUA). This EUA will remain  in effect (meaning this test can be used) for the duration of the COVID-19 declaration under Section 564(b)(1) of the Act, 21 U.S.C.section 360bbb-3(b)(1), unless the authorization is terminated  or revoked sooner.       Influenza A by PCR NEGATIVE NEGATIVE   Influenza B by PCR NEGATIVE NEGATIVE    Comment: (NOTE) The Xpert Xpress SARS-CoV-2/FLU/RSV plus assay is intended as an aid in the diagnosis of influenza from Nasopharyngeal swab specimens and should not be used as a sole basis for treatment. Nasal washings and aspirates are unacceptable for Xpert Xpress SARS-CoV-2/FLU/RSV testing.  Fact Sheet for Patients: BloggerCourse.com  Fact Sheet for Healthcare Providers: SeriousBroker.it  This test is not yet approved or cleared by the [place] FDA and has been authorized for  detection and/or diagnosis of SARS-CoV-2 by FDA under an Emergency Use Authorization (EUA). This EUA will remain in effect (meaning this test can be used) for the duration of the COVID-19 declaration under Section 564(b)(1) of the Act, 21 U.S.C. section 360bbb-3(b)(1), unless the authorization is terminated or revoked.  Performed at [facility], [address] [place], [place] [address]    DG Knee 1-2 Views Left  Result Date: 05/12/2020 CLINICAL DATA:  Fall.  Left knee swelling. EXAM: LEFT KNEE - 1-2 VIEW COMPARISON:  None. FINDINGS: There is patellar B[place]. There is an osseous fragment displaced 2.8 cm superior to the superior patellar pole that may represent  a displaced enthesophyte or patellar fracture pregnant. Large amount of soft tissue edema in the region of the quadriceps tendon most consistent with quadriceps tendon rupture. There is a joint effusion. No fracture of the femur, tibia, or fibula. Tibiofemoral alignment is normal. IMPRESSION: 1. Findings consistent with quadriceps tendon rupture. 2. Displacement of an osseous fragment 2.8 cm superior to the superior patellar pole, possibly displaced enthesophyte or patellar fracture. 3. Joint effusion. Electronically Signed   By: Narda Rutherford M.D.   On: 05/12/2020 20:17   DG Knee 1-2 Views Right  Result Date: 05/12/2020 CLINICAL DATA:  Right knee pain after fall. EXAM: RIGHT KNEE - 1-2 VIEW COMPARISON:  None. FINDINGS: Mild patellar Baja. Large amount of soft tissue edema in the suprapatellar region. There is a 6 mm osseous fragment displaced 2.4 cm proximal to the superior patella. Joint effusion. No fracture of the femur, proximal tibia or fibula. Tibiofemoral alignment is maintained. IMPRESSION: 1. Displaced 6 mm osseous fragment adjacent to the superior patella with associated soft tissue edema and mild patellar Baja. Findings are suggestive of quadriceps tendon injury/rupture. 2. Joint effusion. Electronically Signed   By:  Narda Rutherford M.D.   On: 05/12/2020 20:24    Review of Systems  Constitutional: Positive for activity change.  HENT: Negative.   Eyes: Negative.   Respiratory: Negative.   Cardiovascular: Negative.   Gastrointestinal: Negative.   Endocrine: Negative.   Musculoskeletal: Positive for gait problem and joint swelling.  Allergic/Immunologic: Negative.     Blood pressure 137/76, pulse 76, temperature 98.4 F (36.9 C), temperature source Oral, resp. rate 17, height 5\' 9"  (1.753 m), weight 99.8 kg, SpO2 94 %. Physical Exam HENT:     Head: Normocephalic.     Right Ear: External ear normal.     Left Ear: External ear normal.     Nose: Nose normal.     Mouth/Throat:     Mouth: Mucous membranes are moist.  Eyes:     Conjunctiva/sclera: Conjunctivae normal.  Cardiovascular:     Rate and Rhythm: Normal rate.     Pulses: Normal pulses.  Pulmonary:     Effort: Pulmonary effort is normal.  Abdominal:     Palpations: Abdomen is soft.  Musculoskeletal:     Cervical back: Neck supple.     Comments: Bilateral knee pain and swelling   Obvious quad rupture on the left with unable to lift left leg   Right knee has significant pain and weakness as well  Skin:    General: Skin is dry.     Capillary Refill: Capillary refill takes less than 2 seconds.  Neurological:     General: No focal deficit present.     Mental Status: He is alert.  Psychiatric:        Mood and Affect: Mood normal.      Assessment Principal Problem:   Traumatic rupture of left quadriceps tendon Active Problems:   Acute pain of right knee   Esophageal reflux   Essential hypertension   Fatty liver disease, nonalcoholic   Gastro-esophageal reflux disease with esophagitis, without bleeding   Type II diabetes mellitus (HCC)   Plan Patient was transferred from [facility] med center [facility]    MRIs of both knees are pending   IV fluids, UA, urine culture, HgbA1c were ordered to address lab abnormalities   Dr [facility]  and his PA Meghan will see in am  to address lab abnormalities    Eulah Pont, PA-C 05/13/2020, 2:52  AM

## 2020-05-14 ENCOUNTER — Encounter (HOSPITAL_COMMUNITY): Payer: Self-pay | Admitting: Orthopedic Surgery

## 2020-05-14 LAB — URINE CULTURE: Culture: <10000 — AB

## 2020-05-14 LAB — BASIC METABOLIC PANEL
Anion gap: 10 (ref 5–15)
BUN: 20 mg/dL (ref 6–20)
CO2: 21 mmol/L — ABNORMAL LOW (ref 22–32)
Calcium: 8.3 mg/dL — ABNORMAL LOW (ref 8.9–10.3)
Chloride: 102 mmol/L (ref 98–111)
Creatinine, Ser: 0.96 mg/dL (ref 0.61–1.24)
GFR, Estimated: >60 mL/min (ref >60)
Glucose, Bld: 136 mg/dL — ABNORMAL HIGH (ref 70–99)
Potassium: 4.1 mmol/L (ref 3.5–5.1)
Sodium: 133 mmol/L — ABNORMAL LOW (ref 135–145)

## 2020-05-14 LAB — CBC
HCT: 36.2 % — ABNORMAL LOW (ref 39.0–52.0)
Hemoglobin: 11.9 g/dL — ABNORMAL LOW (ref 13.0–17.0)
MCH: 30.2 pg (ref 26.0–34.0)
MCHC: 32.9 g/dL (ref 30.0–36.0)
MCV: 91.9 fL (ref 80.0–100.0)
Platelets: 259 10*3/uL (ref 150–400)
RBC: 3.94 MIL/uL — ABNORMAL LOW (ref 4.22–5.81)
RDW: 12.3 % (ref 11.5–15.5)
WBC: 16.4 10*3/uL — ABNORMAL HIGH (ref 4.0–10.5)
nRBC: 0 % (ref 0.0–0.2)

## 2020-05-14 MED ORDER — SODIUM CHLORIDE 0.9 % IV BOLUS
1000.0000 mL | Freq: Once | INTRAVENOUS | Status: AC
  Administered 2020-05-14: 1000 mL via INTRAVENOUS

## 2020-05-14 NOTE — Evaluation (Signed)
Occupational Therapy Evaluation Patient Details Name: Arthur Ayers MRN: [id number] DOB: Feb 19, 1965 Today's Date: 05/14/2020    History of Present Illness 55 y.o. male admitted on 05/12/20 for fall from truck with bil patellar tendon injury s/p bil quad tendon repair on day of admission.  Pt WBAT post op, no knee flexion ROM, KI bil at all times.  Pt with significant PMH of R shoulder surgery (RTC repair 2015), low back bulging disc, HTN.   Clinical Impression   Pt admitted with the above diagnoses and presents with below problem list. Pt will benefit from continued acute OT to address the below listed deficits and maximize independence with basic ADLs prior to d/c home with family providing assistance. At baseline, pt is independent with BADLs and IADLs. Pt currently needs up to max +2 assist with LB ADLs and functional transfers, min guard once in standing position and able to walk bathroom distance this session. Plan to discuss AE and compensatory strategies for LB ADLs next session.      Follow Up Recommendations  Home health OT;Supervision/Assistance - 24 hour    Equipment Recommendations  3 in 1 bedside commode;Hospital bed    Recommendations for Other Services       Precautions / Restrictions Precautions Precautions: Fall Required Braces or Orthoses: Knee Immobilizer - Right;Knee Immobilizer - Left Knee Immobilizer - Right: On at all times Knee Immobilizer - Left: On at all times Restrictions Weight Bearing Restrictions: No RLE Weight Bearing: Weight bearing as tolerated LLE Weight Bearing: Weight bearing as tolerated Other Position/Activity Restrictions: no knee flexion ROM      Mobility Bed Mobility Overal bed mobility: Needs Assistance Bed Mobility: Supine to Sit     Supine to sit: Min assist;HOB elevated     General bed mobility comments: up in recliner at start and end of session    Transfers Overall transfer level: Needs assistance Equipment used:  Rolling walker (2 wheeled) Transfers: Sit to/from Stand Sit to Stand: Max assist;+2 physical assistance;From elevated surface         General transfer comment: Pt struggled to stand from boosted recliner height. Ultimately needed +2 max A; had been sitting in recliner for awhile.    Balance Overall balance assessment: Needs assistance Sitting-balance support: Feet supported;Bilateral upper extremity supported Sitting balance-Leahy Scale: Fair     Standing balance support: Bilateral upper extremity supported Standing balance-Leahy Scale: Poor                             ADL either performed or assessed with clinical judgement   ADL Overall ADL's : Needs assistance/impaired Eating/Feeding: Set up;Sitting   Grooming: Set up;Sitting   Upper Body Bathing: Set up;Sitting;Min guard   Lower Body Bathing: Maximal assistance;+2 for safety/equipment;+2 for physical assistance Lower Body Bathing Details (indicate cue type and reason): increased assistance to powerup from  boosted seat height. Assist vs AE for bathing tasks d/t B knee immobilizers Upper Body Dressing : Set up;Sitting;Min guard   Lower Body Dressing: Maximal assistance;+2 for physical assistance;+2 for safety/equipment Lower Body Dressing Details (indicate cue type and reason): increased assistance to powerup from  boosted seat height. Assist vs AE for bathing tasks d/t B knee immobilizers Toilet Transfer: Maximal assistance;+2 for physical assistance;Ambulation;BSC;RW Toilet Transfer Details (indicate cue type and reason): +1 to go from standing to sitting on BSC. +2 to stand 2/2 B KI Toileting- Clothing Manipulation and Hygiene: Maximal assistance;Sitting/lateral lean  Functional mobility during ADLs: Min guard;Rolling walker General ADL Comments: Struggled the most with standing from recliner with boosted seat height (+2 assist) but once on his feet able to walk bathroom distance at min guard level.  Brief introduction on AE for LB ADLs, plan to go into more detail next session.     Vision         Perception     Praxis      Pertinent Vitals/Pain Pain Assessment: 0-10 Pain Score: 7  Pain Location: R > L knee while walking in the room Pain Descriptors / Indicators: Grimacing;Guarding Pain Intervention(s): Limited activity within patient's tolerance;Monitored during session;Repositioned;Patient requesting pain meds-RN notified     Hand Dominance Right   Extremity/Trunk Assessment Upper Extremity Assessment Upper Extremity Assessment: Overall WFL for tasks assessed;RUE deficits/detail RUE Deficits / Details: WNL AROM, pt reports some residual deficits in R shoulder mainly limited in amount of resistance in overhead position ("I can only lift up 25#")   Lower Extremity Assessment Lower Extremity Assessment: Defer to PT evaluation   Cervical / Trunk Assessment Cervical / Trunk Assessment: Other exceptions Cervical / Trunk Exceptions: reports low back disc problems that he has to be careful with.   Communication Communication Communication: No difficulties   Cognition Arousal/Alertness: Awake/alert Behavior During Therapy: WFL for tasks assessed/performed Overall Cognitive Status: Within Functional Limits for tasks assessed                                     General Comments       Exercises Exercises: Total Joint Total Joint Exercises Ankle Circles/Pumps: AROM;Both;20 reps;Other (comment) (encouraged hourly for antiembolic/circulation purposes.)   Shoulder Instructions      Home Living Family/patient expects to be discharged to:: Private residence Living Arrangements: Spouse/significant other;Children (2 daughters 21 and 37) Available Help at Discharge: Family;Available 24 hours/day Type of Home: House Home Access: Stairs to enter [employer] of Steps: 2-3 (garage, front is 3-4) Entrance Stairs-Rails: None Home Layout: Two  level Alternate Level Stairs-Number of Steps: flight Alternate Level Stairs-Rails: Left Bathroom Shower/Tub: Tub only;Walk-in shower   Bathroom Toilet: Standard Bathroom Accessibility: Yes   Home Equipment: None   Additional Comments: works for [employer]      Prior Functioning/Environment Level of Independence: Independent        Comments: has had R shoulder surgery in the past        OT Problem List: Decreased activity tolerance;Impaired balance (sitting and/or standing);Decreased knowledge of use of DME or AE;Decreased knowledge of precautions;Pain      OT Treatment/Interventions: Self-care/ADL training;DME and/or AE instruction;Therapeutic activities;Patient/family education;Balance training    OT Goals(Current goals can be found in the care plan section) Acute Rehab OT Goals Patient Stated Goal: to get home, heal well and recover OT Goal Formulation: With patient Time For Goal Achievement: 05/28/20 Potential to Achieve Goals: Good ADL Goals Pt Will Perform Grooming: with set-up;sitting Pt Will Perform Lower Body Bathing: with mod assist;sit to/from stand;sitting/lateral leans;with adaptive equipment Pt Will Perform Lower Body Dressing: with mod assist;sitting/lateral leans;sit to/from stand;with adaptive equipment Pt Will Transfer to Toilet: with mod assist;ambulating;bedside commode Pt Will Perform Toileting - Clothing Manipulation and hygiene: with mod assist;sitting/lateral leans;sit to/from stand;with adaptive equipment Additional ADL Goal #1: Pt will complete bed mobility at max A level to prepare for EOB/OOB ADLs.  OT Frequency: Min 2X/week   Barriers to D/C:    currently needs +2  assist to stand, sounds like he may have that available at home but not completely sure       Co-evaluation              AM-PAC OT "6 Clicks" Daily Activity     Outcome Measure Help from another person eating meals?: None Help from another person taking care of personal  grooming?: None Help from another person toileting, which includes using toliet, bedpan, or urinal?: A Lot Help from another person bathing (including washing, rinsing, drying)?: A Lot Help from another person to put on and taking off regular upper body clothing?: A Little Help from another person to put on and taking off regular lower body clothing?: A Lot 6 Click Score: 17   End of Session Equipment Utilized During Treatment: Rolling walker;Right knee immobilizer;Left knee immobilizer Nurse Communication: Other (comment);Mobility status;Patient requests pain meds (NT assisted with sit>stand transfer)  Activity Tolerance: Patient tolerated treatment well;Patient limited by pain;Other (comment) (increased pain in BLE WB position) Patient left: in chair;with call bell/phone within reach  OT Visit Diagnosis: Unsteadiness on feet (R26.81);Pain                Time: 7564-3329 OT Time Calculation (min): 25 min Charges:  OT General Charges $OT Visit: 1 Visit OT Evaluation $OT Eval Moderate Complexity: 1 Mod OT Treatments $Self Care/Home Management : 8-22 mins  Raynald Kemp, OT Acute Rehabilitation Services Pager: [phone] Office: [phone]   Pilar Grammes 05/14/2020, 2:29 PM

## 2020-05-14 NOTE — Op Note (Signed)
05/13/2020  8:36 AM  PATIENT:  Arthur Ayers    PRE-OPERATIVE DIAGNOSIS:  rupture left patellar tendon  POST-OPERATIVE DIAGNOSIS:  Same  PROCEDURE:  REPAIR QUADRICEP TENDON  SURGEON:  Sheral Apley, MD  ASSISTANT: Levester Fresh, PA-C, he was present and scrubbed throughout the case, critical for completion in a timely fashion, and for retraction, instrumentation, and closure.   ANESTHESIA:   gen/block  PREOPERATIVE INDICATIONS:  Arthur Ayers is a  55 y.o. male with a diagnosis of rupture left patellar tendon who elected for surgical management.    The risks benefits and alternatives were discussed with the patient preoperatively including but not limited to the risks of infection, bleeding, nerve injury, cardiopulmonary complications, the need for revision surgery, among others, and the patient was willing to proceed.  OPERATIVE FINDINGS: complete rupture  BLOOD LOSS: min  TOURNIQUET TIME: 60 on Left, 45 on R  OPERATIVE PROCEDURE:  Patient was identified in the preoperative holding area and site was marked by me He was transported to the operating theater and placed on the table in supine position taking care to pad all bony prominences. After a preincinduction time out anesthesia was induced. The bilateral lower extremity was prepped and draped in normal sterile fashion and a pre-incision timeout was performed. He received ancef for preoperative antibiotics.   I made an incision directly over his traumatic injury. I dissected down to the level of the peritenon and elevated skin flaps over top of this there were full-thickness.  I then incised the peritenon it was partially ruptured as well. Identified his rupture tendon.  I debrided tendon from the patella allowing a good bony bed for healing.  I then used 2 #2 FiberWire as a whipstitched up and back in the Tendon leaving me with 4 total strands coming out.  I thoroughly irrigated the joint  Next I used a drill bit  to drill 3 holes in the patella taking care to not penetrate the articular surface. I used a [place] suture passer to pass all 4 stitches through these holes.  The patella reapproximated well to the tendon. I tied the stitches over top of the bone bridge of the patella.  I then stressed the repair and it was stable to 45 degrees.  I then thoroughly irrigated the wound again.  I repaired the medial collateral capsul  I then repaired the lateral collateral capsul  I closed the ruptured peritenon as well as the surgically incised peritenon with an 0 Vicryl. Then closed the skin with a monocryl stitch  Next I turned my attention to the R side.   I made an incision directly over his traumatic injury. I dissected down to the level of the peritenon and elevated skin flaps over top of this there were full-thickness.  I then incised the peritenon it was partially ruptured as well. Identified his rupture tendon.  I debrided tendon from the patella allowing a good bony bed for healing.  I then used 2 #2 FiberWire as a whipstitched up and back in the Tendon leaving me with 4 total strands coming out.  I thoroughly irrigated the joint  Next I used a drill bit to drill 3 holes in the patella taking care to not penetrate the articular surface. I used a [place] suture passer to pass all 4 stitches through these holes.  The patella reapproximated well to the tendon. I tied the stitches over top of the bone bridge of the patella.  I then stressed  the repair and it was stable to 45 degrees.  I then thoroughly irrigated the wound again.  I repaired the medial collateral capsul  I then repaired the lateral collateral capsul  I closed the ruptured peritenon as well as the surgically incised peritenon with an 0 Vicryl. Then closed the skin with a monocryl stitch  Sterile dressing was applied the knee was placed in a knee immobilizer and taken the PACU in stable condition.  POST OPERATIVE PLAN:  WBAT in immobilizer, DVT px: early ambulation and chemical px

## 2020-05-14 NOTE — Plan of Care (Signed)
  Problem: Clinical Measurements: Goal: Will remain free from infection 05/14/2020 0022 by Lenord Fellers, RN Outcome: Progressing 05/14/2020 0021 by Lenord Fellers, RN Outcome: Progressing Goal: Diagnostic test results will improve Outcome: Progressing

## 2020-05-14 NOTE — Plan of Care (Signed)
  Problem: Education: Goal: Knowledge of General Education information will improve Description: Including pain rating scale, medication(s)/side effects and non-pharmacologic comfort measures Outcome: Progressing   Problem: Health Behavior/Discharge Planning: Goal: Ability to manage health-related needs will improve Outcome: Progressing   Problem: Clinical Measurements: Goal: Ability to maintain clinical measurements within normal limits will improve Outcome: Progressing   Problem: Activity: Goal: Risk for activity intolerance will decrease Outcome: Progressing   Problem: Nutrition: Goal: Adequate nutrition will be maintained Outcome: Progressing   Problem: Coping: Goal: Level of anxiety will decrease Outcome: Progressing   Problem: Elimination: Goal: Will not experience complications related to urinary retention Outcome: Progressing   Problem: Pain Managment: Goal: General experience of comfort will improve Outcome: Progressing   Problem: Safety: Goal: Ability to remain free from injury will improve Outcome: Progressing   

## 2020-05-14 NOTE — Anesthesia Postprocedure Evaluation (Signed)
Anesthesia Post Note  Patient: Arthur Ayers  Procedure(s) Performed: REPAIR QUADRICEP TENDON (Bilateral Knee)     Patient location during evaluation: PACU Anesthesia Type: General Level of consciousness: awake and alert Pain management: pain level controlled Vital Signs Assessment: post-procedure vital signs reviewed and stable Respiratory status: spontaneous breathing, nonlabored ventilation, respiratory function stable and patient connected to nasal cannula oxygen Cardiovascular status: blood pressure returned to baseline and stable Postop Assessment: no apparent nausea or vomiting Anesthetic complications: no   No complications documented.  Last Vitals:  Vitals:   05/14/20 0751 05/14/20 1450  BP: 120/62 (!) 114/53  Pulse: 74 69  Resp: 16 14  Temp: 36.5 C 36.8 C  SpO2: 98% 94%    Last Pain:  Vitals:   05/14/20 1450  TempSrc: Oral  PainSc:                  Orpah Hausner S

## 2020-05-14 NOTE — Evaluation (Signed)
Physical Therapy Evaluation Patient Details Name: Arthur Ayers MRN: [id number] DOB: [birth year] Today's Date: 05/14/2020   History of Present Illness  55 y.o. male admitted on 05/12/20 forfall from truck with bil patellar tendon injury s/p bil quad tendon repair on day of admission.  Pt WBAT post op, no knee flexion ROM, KI bil at all times.  Pt with significant PMH of R shoulder surgery (RTC repair 2015), low back bulging disc, HTN.  Clinical Impression  Pt was able to walk to the bathroom and sit up in the chair starting with mod assist, and progressing to min assist for mobility by end of session.  Wife is present and hands on for education and training on how to help her husband.  We reviewed KI donning, precautions (no knee flexion ROM, WBAT) and started strategizing what equipment may be needed to make it home safely.   PT to follow acutely for deficits listed below.    Follow Up Recommendations Home health PT;Supervision for mobility/OOB    Equipment Recommendations  Rolling walker with 5" wheels;3in1 (PT);Hospital bed;Other (comment) (shower seat, ramp?)    Recommendations for Other Services       Precautions / Restrictions Precautions Precautions: Fall Required Braces or Orthoses: Knee Immobilizer - Right;Knee Immobilizer - Left Knee Immobilizer - Right: On at all times Knee Immobilizer - Left: On at all times Restrictions Weight Bearing Restrictions: No RLE Weight Bearing: Weight bearing as tolerated LLE Weight Bearing: Weight bearing as tolerated Other Position/Activity Restrictions: no knee flexion ROM      Mobility  Bed Mobility Overal bed mobility: Needs Assistance Bed Mobility: Supine to Sit     Supine to sit: Min assist;HOB elevated     General bed mobility comments: Min assist to help progress both legs over EOB, pt controlling his trunk and his upper body with bed rail and arms.    Transfers Overall transfer level: Needs assistance Equipment used:  Rolling walker (2 wheeled) Transfers: Sit to/from Stand Sit to Stand: +2 physical assistance;Mod assist;Min assist         General transfer comment: Started with two person mod for first attempt to stand from elevated bed, progressed quickly to min assist from elevated BSC and to sit down to elevated recliner chair.  Ambulation/Gait Ambulation/Gait assistance: Min assist;+2 safety/equipment Gait Distance (Feet): 15 Feet (x2) Assistive device: Rolling walker (2 wheeled) Gait Pattern/deviations: Step-to pattern     General Gait Details: Pt started with swing through pattern, but then was able to start putting feet down and taking steps with the support of the RW.  He was more stable balance wise with the stepping pattern.  Stairs            Wheelchair Mobility    Modified Rankin (Stroke Patients Only)       Balance Overall balance assessment: Needs assistance Sitting-balance support: Feet supported;Bilateral upper extremity supported Sitting balance-Leahy Scale: Fair     Standing balance support: Bilateral upper extremity supported Standing balance-Leahy Scale: Poor                               Pertinent Vitals/Pain Pain Assessment: 0-10 Pain Score: 5  Pain Location: R > L knee Pain Descriptors / Indicators: Grimacing;Guarding Pain Intervention(s): Monitored during session;Limited activity within patient's tolerance;Repositioned;Patient requesting pain meds-RN notified;RN gave pain meds during session    Home Living Family/patient expects to be discharged to:: Private residence Living Arrangements: Spouse/significant other;Children (2  daughters 86 and 76) Available Help at Discharge: Family;Available 24 hours/day Type of Home: House Home Access: Stairs to enter Entrance Stairs-Rails: None Entrance Stairs-Number of Steps: 2-3 (garage, front is 3-4) Home Layout: Two level Home Equipment: None Additional Comments: works for [employer]    Prior Function  Level of Independence: Independent         Comments: has had R shoulder surgery in the past     Hand Dominance   Dominant Hand: Right    Extremity/Trunk Assessment             Cervical / Trunk Assessment Cervical / Trunk Assessment: Other exceptions Cervical / Trunk Exceptions: reports low back disc problems that he has to be careful with.  Communication   Communication: No difficulties  Cognition Arousal/Alertness: Awake/alert Behavior During Therapy: WFL for tasks assessed/performed Overall Cognitive Status: Within Functional Limits for tasks assessed                                        General Comments      Exercises Total Joint Exercises Ankle Circles/Pumps: AROM;Both;20 reps;Other (comment) (encouraged hourly for antiembolic/circulation purposes.)   Assessment/Plan    PT Assessment    PT Problem List         PT Treatment Interventions      PT Goals (Current goals can be found in the Care Plan section)  Acute Rehab PT Goals Patient Stated Goal: to get home, heal well and recover    Frequency Min 5X/week   Barriers to discharge        Co-evaluation               AM-PAC PT "6 Clicks" Mobility  Outcome Measure Help needed turning from your back to your side while in a flat bed without using bedrails?: A Little Help needed moving from lying on your back to sitting on the side of a flat bed without using bedrails?: A Little Help needed moving to and from a bed to a chair (including a wheelchair)?: A Little Help needed standing up from a chair using your arms (e.g., wheelchair or bedside chair)?: A Lot Help needed to walk in hospital room?: A Little Help needed climbing 3-5 steps with a railing? : Total 6 Click Score: 15    End of Session Equipment Utilized During Treatment: Gait belt;Right knee immobilizer;Left knee immobilizer Activity Tolerance: Patient limited by pain Patient left: in chair;with call bell/phone within  reach;with family/visitor present Nurse Communication: Mobility status;Patient requests pain meds PT Visit Diagnosis: Muscle weakness (generalized) (M62.81);Difficulty in walking, not elsewhere classified (R26.2);Pain Pain - Right/Left: Right (bil) Pain - part of body: Knee (bil knees)    Time: 0623-7628 PT Time Calculation (min) (ACUTE ONLY): 31 min   Charges:   PT Evaluation $PT Eval Moderate Complexity: 1 Mod PT Treatments $Gait Training: 8-22 mins       Corinna Capra, PT, DPT  Acute Rehabilitation [phone] pager [phone]) [phone] office

## 2020-05-14 NOTE — Progress Notes (Signed)
    Subjective: Patient reports pain as mild to moderate. Tolerating diet. Urinating. No CP, SOB. Has not worked with PT/OT on mobilizing OOB yet.   Objective:   VITALS:   Vitals:   05/13/20 1857 05/13/20 2131 05/14/20 0420 05/14/20 0751  BP: 128/81 126/71 134/74 120/62  Pulse: 80 84 82 74  Resp: 17 18 18 16   Temp: 98.2 F (36.8 C) 97.8 F (36.6 C) 98.3 F (36.8 C) 97.7 F (36.5 C)  TempSrc: Oral Oral Oral Oral  SpO2: 92% 95% 95% 98%  Weight:      Height:       CBC Latest Ref Rng & Units 05/13/2020 05/12/2020  WBC 4.0 - 10.5 K/uL 9.6 15.4(H)  Hemoglobin 13.0 - 17.0 g/dL 05/14/2020 69.6  Hematocrit 78.9 - 52.0 % 37.6(L) 42.9  Platelets 150 - 400 K/uL 248 289   BMP Latest Ref Rng & Units 05/13/2020 05/12/2020  Glucose 70 - 99 mg/dL 05/14/2020) 017(P)  BUN 6 - 20 mg/dL 19 102(H)  Creatinine 85(I - 1.24 mg/dL 7.78 2.42  Sodium 3.53 - 145 mmol/L 133(L) 130(L)  Potassium 3.5 - 5.1 mmol/L 4.0 4.0  Chloride 98 - 111 mmol/L 99 97(L)  CO2 22 - 32 mmol/L 28 22  Calcium 8.9 - 10.3 mg/dL 614) 8.9   Intake/Output      03/29 0701 03/30 0700 03/30 0701 03/31 0700   I.V. (mL/kg) 1966 (19.7)    IV Piggyback 400    Total Intake(mL/kg) 2366 (23.7)    Urine (mL/kg/hr) 1500 (0.6)    Blood 100    Total Output 1600    Net +766            Physical Exam: General: NAD. Sleeping in bed Resp: No increased wob Cardio: regular rate and rhythm ABD soft Neurologically intact MSK Neurovascularly intact Sensation intact distally Intact pulses distally Dorsiflexion/Plantar flexion intact Incision: dressing C/D/I B/L knee immobilizers in place  Assessment: 1 Day Post-Op  S/P Procedure(s) (LRB): REPAIR QUADRICEP TENDON (Bilateral) by Dr. 4/31. Jewel Baize on 05/13/20  Principal Problem:   Traumatic rupture of left quadriceps tendon Active Problems:   Acute pain of right knee   Esophageal reflux   Essential hypertension   Fatty liver disease, nonalcoholic   Gastro-esophageal reflux disease  with esophagitis, without bleeding   Type II diabetes mellitus (HCC)   Plan: Advance diet Up with therapy Incentive Spirometry Elevate and Apply ice   Weightbearing: WBAT B/L LE. Do not allow any knee flexion. Keep legs in extension Insicional and dressing care: Dressings left intact until follow-up and Reinforce dressings as needed Orthopedic device(s): B/L knee immobilizers. Should be wore at all times expect for showering.  Showering: Keep dressing dry VTE prophylaxis: Lovenox 40mg  qd while inpatient. Will switch to ASA 81mg  BID once d/c, SCDs, ambulation Pain control: continue current regimen Follow - up plan: 2 weeks Contact information:  05/15/20 MD, PA-C  Dispo: TBD based on PT/OT evals. Patient reports he lives in a 2 story home with his wife and 2 daughters but that his bedroom is on the first floor.     , PA-C Office [phone] 05/14/2020, 8:04 AM

## 2020-05-15 DIAGNOSIS — S76111A Strain of right quadriceps muscle, fascia and tendon, initial encounter: Secondary | ICD-10-CM

## 2020-05-15 LAB — CBC
HCT: 37.2 % — ABNORMAL LOW (ref 39.0–52.0)
Hemoglobin: 12.1 g/dL — ABNORMAL LOW (ref 13.0–17.0)
MCH: 30 pg (ref 26.0–34.0)
MCHC: 32.5 g/dL (ref 30.0–36.0)
MCV: 92.1 fL (ref 80.0–100.0)
Platelets: 249 10*3/uL (ref 150–400)
RBC: 4.04 MIL/uL — ABNORMAL LOW (ref 4.22–5.81)
RDW: 12.6 % (ref 11.5–15.5)
WBC: 16.8 10*3/uL — ABNORMAL HIGH (ref 4.0–10.5)
nRBC: 0 % (ref 0.0–0.2)

## 2020-05-15 LAB — COMPREHENSIVE METABOLIC PANEL
ALT: 38 U/L (ref 0–44)
AST: 27 U/L (ref 15–41)
Albumin: 3.1 g/dL — ABNORMAL LOW (ref 3.5–5.0)
Alkaline Phosphatase: 55 U/L (ref 38–126)
Anion gap: 5 (ref 5–15)
BUN: 14 mg/dL (ref 6–20)
CO2: 27 mmol/L (ref 22–32)
Calcium: 8 mg/dL — ABNORMAL LOW (ref 8.9–10.3)
Chloride: 100 mmol/L (ref 98–111)
Creatinine, Ser: 0.96 mg/dL (ref 0.61–1.24)
GFR, Estimated: >60 mL/min (ref >60)
Glucose, Bld: 147 mg/dL — ABNORMAL HIGH (ref 70–99)
Potassium: 4 mmol/L (ref 3.5–5.1)
Sodium: 132 mmol/L — ABNORMAL LOW (ref 135–145)
Total Bilirubin: 0.9 mg/dL (ref 0.3–1.2)
Total Protein: 6.4 g/dL — ABNORMAL LOW (ref 6.5–8.1)

## 2020-05-15 NOTE — Progress Notes (Signed)
  Progress Note   Date: 05/14/2020  Patient Name: Arthur Ayers        MRN#: [id number]   Review of the patient's clinical findings supports the diagnosis of hyponatremia.      I will continue to observe this and iv fluids until taking good po. Otherwise no clinical intervention

## 2020-05-15 NOTE — Progress Notes (Addendum)
NCM reached out to [employer] Occupational nurse IDA [phone]) to learn of workman's compensation claim. Voice message left ...awaitng call back. TOC team monitoring and will assist with TOC needs. Gae Gallop RN,BSN,CM [phone]  05/15/2020 1430 NCM received call from Nurse Malachi Bonds. Malachi Bonds provided NCM with workman's compensation infor: Claim # [id number], [address] Jan Fireman Schiff [phone]). NCM called Phineas Semen , voice message left ...awaiting call back. Gae Gallop RN, [place] [phone]

## 2020-05-15 NOTE — Progress Notes (Signed)
Inpatient Rehab Admissions Coordinator Note:   Per updated therapy recommendations, pt was screened for CIR candidacy by Estill Dooms, PT, DPT.  At this time we are recommending a CIR consult.  If pt would like to be considered for rehab admit, please place an IP Rehab MD consult.  Please contact me with questions.   Estill Dooms, PT, DPT [phone] 05/15/20 4:55 PM

## 2020-05-15 NOTE — Plan of Care (Signed)

## 2020-05-15 NOTE — Plan of Care (Signed)

## 2020-05-15 NOTE — Plan of Care (Signed)
  Problem: Clinical Measurements: Goal: Will remain free from infection Outcome: Progressing Goal: Cardiovascular complication will be avoided Outcome: Progressing   Problem: Activity: Goal: Risk for activity intolerance will decrease Outcome: Progressing   

## 2020-05-15 NOTE — Progress Notes (Signed)
Physical Therapy Treatment Patient Details Name: Arthur Ayers MRN: [id number] DOB: 1965-04-05 Today's Date: 05/15/2020    History of Present Illness 55 y.o. male admitted on 05/12/20 for fall from truck with bil patellar tendon injury s/p bil quad tendon repair on day of admission.  Pt WBAT post op, no knee flexion ROM, KI bil at all times.  Pt with significant PMH of R shoulder surgery (RTC repair 2015), low back bulging disc, HTN.    PT Comments    Pt was able to attempt a very small set of stairs, but continues to require two person heavy mod assist to stand from even an elevated chair.  He is min assist once up on his feet and may struggle with his 6" steps at home (we tried 4" steps today with his wife). HEP give of all straight leg exercises.  Pt would like to consider CIR if he is a candidate.   Follow Up Recommendations  CIR     Equipment Recommendations  Rolling walker with 5" wheels;3in1 (PT);Hospital bed;Other (comment) (shower seat, ramp rental)    Recommendations for Other Services Rehab consult     Precautions / Restrictions Precautions Precautions: Fall Required Braces or Orthoses: Knee Immobilizer - Right;Knee Immobilizer - Left Knee Immobilizer - Right: On at all times Knee Immobilizer - Left: On at all times Restrictions Weight Bearing Restrictions: Yes RLE Weight Bearing: Weight bearing as tolerated LLE Weight Bearing: Weight bearing as tolerated Other Position/Activity Restrictions: no knee flexion ROM    Mobility  Bed Mobility Overal bed mobility: Needs Assistance Bed Mobility: Supine to Sit     Supine to sit: Min assist     General bed mobility comments: Min assist to help progress both legs to EOB.  Pt much better and faster at mobilizing to EOB today than yesterday    Transfers Overall transfer level: Needs assistance Equipment used: Rolling walker (2 wheeled) Transfers: Sit to/from Stand Sit to Stand: Min assist;Mod assist;+2 physical  assistance         General transfer comment: Min assist to stand from maximally elevated bed to RW, two person heavy mod assist to stand from much lower recliner chair.  Ambulation/Gait Ambulation/Gait assistance: Min assist Gait Distance (Feet): 5 Feet (5'x2 back and forth from recliner to stairs) Assistive device: Rolling walker (2 wheeled) Gait Pattern/deviations: Step-to pattern         Stairs Stairs: Yes Stairs assistance: Min assist;+2 physical assistance Stair Management: No rails;Backwards;With walker Number of Stairs: 2 General stair comments: Practiced small 4" steps (low side of practice stairs in ortho gym) with pt and his wife.  PT first visually demonstrated, provided handout and then pt, therapist and wife practiced goin gup and down two small steps with RW backwards.  Heavy min assist with wife stabilizing RW and therapist supporting pt and assisting in stabilizing the RW from behind.  Pt had significant pain and difficulty attempting these small stairs, so I am not sure how getting up/down larger stairs will go.  He may need to rent a ramp for home access until he is allowed to bend his knees   Wheelchair Mobility    Modified Rankin (Stroke Patients Only)       Balance Overall balance assessment: Needs assistance Sitting-balance support: Bilateral upper extremity supported;Feet supported Sitting balance-Leahy Scale: Fair     Standing balance support: Bilateral upper extremity supported Standing balance-Leahy Scale: Poor Standing balance comment: heavily reliant on bil UE on RW.  Cognition Arousal/Alertness: Awake/alert Behavior During Therapy: WFL for tasks assessed/performed Overall Cognitive Status: Within Functional Limits for tasks assessed                                        Exercises Total Joint Exercises Ankle Circles/Pumps: AROM;Both;10 reps Hip ABduction/ADduction: Both;10 reps;AAROM  (used leg loop/sheet) Straight Leg Raises: AAROM;Both;10 reps (used leg loop/sheet)    General Comments        Pertinent Vitals/Pain Pain Assessment: Faces Faces Pain Scale: Hurts whole lot Pain Location: bil knees Pain Descriptors / Indicators: Grimacing;Guarding Pain Intervention(s): Limited activity within patient's tolerance;Monitored during session;Repositioned;Other (comment) (attempted to get pt to pre medicate by stopping by an hour before, but pt fell asleep and forgot.)    Home Living                      Prior Function            PT Goals (current goals can now be found in the care plan section) Acute Rehab PT Goals Patient Stated Goal: to get home, heal well and recover Progress towards PT goals: Progressing toward goals    Frequency    Min 5X/week      PT Plan Current plan remains appropriate    Co-evaluation              AM-PAC PT "6 Clicks" Mobility   Outcome Measure  Help needed turning from your back to your side while in a flat bed without using bedrails?: A Little Help needed moving from lying on your back to sitting on the side of a flat bed without using bedrails?: A Little Help needed moving to and from a bed to a chair (including a wheelchair)?: A Little Help needed standing up from a chair using your arms (e.g., wheelchair or bedside chair)?: A Lot Help needed to walk in hospital room?: A Little Help needed climbing 3-5 steps with a railing? : A Lot 6 Click Score: 16    End of Session Equipment Utilized During Treatment: Gait belt;Right knee immobilizer;Left knee immobilizer Activity Tolerance: Patient limited by pain Patient left: in chair;with call bell/phone within reach;with family/visitor present Nurse Communication: Patient requests pain meds PT Visit Diagnosis: Muscle weakness (generalized) (M62.81);Difficulty in walking, not elsewhere classified (R26.2);Pain Pain - Right/Left: Right (bil) Pain - part of body: Knee  (bil knees)     Time: 2423-5361 (no charge to wait for wife) PT Time Calculation (min) (ACUTE ONLY): 66 min  Charges:  $Gait Training: 23-37 mins $Therapeutic Activity: 8-22 mins                     Corinna Capra, PT, DPT  Acute Rehabilitation [phone] pager [phone]) [phone] office

## 2020-05-15 NOTE — Progress Notes (Signed)
    Subjective: Patient reports pain as marked. Says he fell asleep in the chair and moved to the bed around 4am. Tolerating diet. Urinating. No CP, SOB. Has worked with PT/OT on mobilizing OOB.   Objective:   VITALS:   Vitals:   05/14/20 1450 05/14/20 1900 05/15/20 0603 05/15/20 0806  BP: (!) 114/53 116/70 (!) 135/57 134/72  Pulse: 69 77 77 82  Resp: 14 15 18 18   Temp: 98.3 F (36.8 C) 98.5 F (36.9 C) 99.2 F (37.3 C) 98.4 F (36.9 C)  TempSrc: Oral Oral Oral Oral  SpO2: 94% 95% 99% 95%  Weight:      Height:       CBC Latest Ref Rng & Units 05/15/2020 05/14/2020 05/13/2020  WBC 4.0 - 10.5 K/uL 16.8(H) 16.4(H) 9.6  Hemoglobin 13.0 - 17.0 g/dL 12.1(L) 11.9(L) 13.0  Hematocrit 39.0 - 52.0 % 37.2(L) 36.2(L) 37.6(L)  Platelets 150 - 400 K/uL 249 259 248   BMP Latest Ref Rng & Units 05/14/2020 05/13/2020 05/12/2020  Glucose 70 - 99 mg/dL 05/14/2020) 161(W) 960(A)  BUN 6 - 20 mg/dL 20 19 540(J)  Creatinine 0.61 - 1.24 mg/dL 81(X 9.14 7.82  Sodium 135 - 145 mmol/L 133(L) 133(L) 130(L)  Potassium 3.5 - 5.1 mmol/L 4.1 4.0 4.0  Chloride 98 - 111 mmol/L 102 99 97(L)  CO2 22 - 32 mmol/L 21(L) 28 22  Calcium 8.9 - 10.3 mg/dL 8.3(L) 8.6(L) 8.9   Intake/Output      03/30 0701 03/31 0700 03/31 0701 04/01 0700   I.V. (mL/kg) 1095.8 (11)    IV Piggyback 0    Total Intake(mL/kg) 1095.8 (11)    Urine (mL/kg/hr) 1650 (0.7)    Blood     Total Output 1650    Net -554.2            Physical Exam: General: NAD. Laying in bed. Dozing on and off Resp: No increased wob Cardio: regular rate and rhythm ABD soft Neurologically intact MSK Neurovascularly intact Sensation intact distally Intact pulses distally Dorsiflexion/Plantar flexion intact Incision: dressing C/D/I ACE wraps and knee immobilizers in place on B/L knees  Assessment: 2 Days Post-Op  S/P Procedure(s) (LRB): REPAIR QUADRICEP TENDON (Bilateral) by Dr. 06/01. Jewel Baize on 05/13/20  Principal Problem:   Traumatic rupture  of left quadriceps tendon Active Problems:   Acute pain of right knee   Esophageal reflux   Essential hypertension   Fatty liver disease, nonalcoholic   Gastro-esophageal reflux disease with esophagitis, without bleeding   Type II diabetes mellitus (HCC)   Traumatic rupture of right quadriceps tendon    Plan: Up with therapy Incentive Spirometry Elevate and Apply ice  Weightbearing: WBAT B/L LE Insicional and dressing care: Dressings left intact until follow-up and Reinforce dressings as needed Orthopedic device(s): knee immobilizer B/L Showering: Keep dressing dry VTE prophylaxis: Lovenox 40mg  qd while inpatient. Will switch to ASA 81mg  bid x 30 days once d/c, SCDs, ambulation Pain control: Tramadol q6h, Tylenol PRN, Dilaudid PRN, Oxy PRN Follow - up plan: 2 weeks Contact information:  05/15/20 MD, PA-C  Dispo: TBD based on more PT/OT evals. Trying to decide if his family is able to provide the help he will need at home or if he needs to go to a SNF    Office Margarita Rana 05/15/2020, 9:33 AM

## 2020-05-16 LAB — COMPREHENSIVE METABOLIC PANEL
ALT: 34 U/L (ref 0–44)
AST: 34 U/L (ref 15–41)
Albumin: 2.8 g/dL — ABNORMAL LOW (ref 3.5–5.0)
Alkaline Phosphatase: 57 U/L (ref 38–126)
Anion gap: 5 (ref 5–15)
BUN: 14 mg/dL (ref 6–20)
CO2: 26 mmol/L (ref 22–32)
Calcium: 8.1 mg/dL — ABNORMAL LOW (ref 8.9–10.3)
Chloride: 103 mmol/L (ref 98–111)
Creatinine, Ser: 0.83 mg/dL (ref 0.61–1.24)
GFR, Estimated: >60 mL/min (ref >60)
Glucose, Bld: 145 mg/dL — ABNORMAL HIGH (ref 70–99)
Potassium: 4.1 mmol/L (ref 3.5–5.1)
Sodium: 134 mmol/L — ABNORMAL LOW (ref 135–145)
Total Bilirubin: 0.9 mg/dL (ref 0.3–1.2)
Total Protein: 6.2 g/dL — ABNORMAL LOW (ref 6.5–8.1)

## 2020-05-16 NOTE — TOC Initial Note (Addendum)
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 Case: Claim # [id number], [facility] [id number]).    From home with wife, [name] . PTA independent with ADL's, no DME usage.  Arthur Ayers (Spouse)     [phone]      Per PT's recent eval/recommdations: CIR. NCM called and left voice message requesting IR consult.   NCM faxed clinicals to [facility] / [name] ( covering adjuster) @  [phone], contact #  [phone].....  TOC team will continue to monitor and assist with TOC needs.   4/4 1410 NCM  called [name]  / [employer]   Barriers to Discharge: Continued Medical Work up   Patient Goals and CMS Choice        Expected Discharge Plan and Services         Living arrangements for the past 2 months: Single Family Home                                      Prior Living Arrangements/Services Living arrangements for the past 2 months: Single Family Home Lives with:: Spouse Patient language and need for interpreter reviewed:: Yes Do you feel safe going back to the place where you live?: Yes      Need for Family Participation in Patient Care: Yes (Comment) Care giver support system in place?: Yes (comment)   Criminal Activity/Legal Involvement Pertinent to Current Situation/Hospitalization: No - Comment as needed  Activities of Daily Living Home Assistive Devices/Equipment: None ADL Screening (condition at time of admission) Patient's cognitive ability adequate to safely complete daily activities?: No Is the patient deaf or have difficulty hearing?: No Does the patient have difficulty seeing, even when wearing  glasses/contacts?: No Does the patient have difficulty concentrating, remembering, or making decisions?: No Patient able to express need for assistance with ADLs?: Yes Does the patient have difficulty dressing or bathing?: No Independently performs ADLs?: Yes (appropriate for developmental age) Does the patient have difficulty walking or climbing stairs?: Yes Weakness of Legs: Both Weakness of Arms/Hands: None  Permission Sought/Granted   Permission granted to share information with : Yes, Verbal Permission Granted              Emotional Assessment Appearance:: Appears stated age Attitude/Demeanor/Rapport: Engaged   Orientation: : Oriented to Self,Oriented to Place,Oriented to  Time,Oriented to Situation Alcohol / Substance Use: Not Applicable Psych Involvement: No (comment)  Admission diagnosis:  Patellar tendon rupture [S86.819A] Fall [W19.XXXA] Rupture of left patellar tendon, initial encounter [S86.812A] Rupture of right quadriceps muscle, initial encounter [W54.627O] Patient Active Problem List   Diagnosis Date Noted  . Traumatic rupture of right quadriceps tendon 05/15/2020  . Acute pain of right knee 05/13/2020  . Traumatic rupture of left quadriceps tendon 05/12/2020  . Class 1 obesity due to excess calories without serious comorbidity with body mass index (BMI) of 31.0 to 31.9 in adult 02/27/2019  . Osteoarthritis of right knee 02/20/2018  . Fatty liver disease, nonalcoholic 11/25/2016  . CKD (chronic kidney disease) stage 2, GFR 60-89 ml/min 02/18/2015  .  Elevated fasting blood sugar 02/18/2015  . Elevation of level of transaminase or lactic acid dehydrogenase (LDH) 02/18/2015  . Essential hypertension 02/18/2015  . Gastro-esophageal reflux disease with esophagitis, without bleeding 02/18/2015  . Mixed hyperlipidemia 02/18/2015  . Esophageal reflux 07/22/2012  . Type II diabetes mellitus (HCC) 07/22/2012   PCP:  Laqueta Due., MD Pharmacy:   [facility], [place] - [address] [place] [address] Phone: [phone] Fax: [phone]     Social Determinants of Health (SDOH) Interventions    Readmission Risk Interventions No flowsheet data found.

## 2020-05-16 NOTE — Progress Notes (Signed)
    Subjective: Patient reports pain as mild to moderate. Slept well. Tolerating diet. Urinating. No CP, SOB. Working with PT/OT on mobilizing OOB. Reports sound like he is having trouble getting up and going but once he does then he is better. They are now recommending CIR and patient agreeable. Since this is a worker's comp case have to get everything approved.   Objective:   VITALS:   Vitals:   05/15/20 0806 05/15/20 1536 05/15/20 1951 05/16/20 0500  BP: 134/72 124/62 136/72 132/68  Pulse: 82 95 (!) 102   Resp: 18 (!) 21 20 18   Temp: 98.4 F (36.9 C) 99.2 F (37.3 C) 100.2 F (37.9 C) 99.4 F (37.4 C)  TempSrc: Oral Oral Oral Oral  SpO2: 95% 94% 90% 94%  Weight:      Height:       CBC Latest Ref Rng & Units 05/15/2020 05/14/2020 05/13/2020  WBC 4.0 - 10.5 K/uL 16.8(H) 16.4(H) 9.6  Hemoglobin 13.0 - 17.0 g/dL 12.1(L) 11.9(L) 13.0  Hematocrit 39.0 - 52.0 % 37.2(L) 36.2(L) 37.6(L)  Platelets 150 - 400 K/uL 249 259 248   BMP Latest Ref Rng & Units 05/16/2020 05/15/2020 05/14/2020  Glucose 70 - 99 mg/dL 05/16/2020) 347(Q) 259(D)  BUN 6 - 20 mg/dL 14 14 20   Creatinine 0.61 - 1.24 mg/dL 638(V 5.64  Sodium 135 - 145 mmol/L 134(L) 132(L) 133(L)  Potassium 3.5 - 5.1 mmol/L 4.1 4.0 4.1  Chloride 98 - 111 mmol/L 103 100 102  CO2 22 - 32 mmol/L 26 27 21(L)  Calcium 8.9 - 10.3 mg/dL 8.1(L) 8.0(L) 8.3(L)   Intake/Output      03/31 0701 04/01 0700 04/01 0701 04/02 0700   I.V. (mL/kg)     IV Piggyback     Total Intake(mL/kg)     Urine (mL/kg/hr) 3925 (1.6)    Total Output 3925    Net -3925            Physical Exam: General: NAD. Sitting up in bed.  Resp: No increased wob Cardio: regular rate and rhythm ABD soft Neurologically intact MSK Neurovascularly intact Sensation intact distally Intact pulses distally Dorsiflexion/Plantar flexion intact Incision: dressing C/D/I, B/L KI in place    Assessment: 3 Days Post-Op  S/P Procedure(s) (LRB): REPAIR QUADRICEP TENDON  (Bilateral) by Dr. 06/01. 06/02 on 05/13/20  Principal Problem:   Traumatic rupture of left quadriceps tendon Active Problems:   Acute pain of right knee   Esophageal reflux   Essential hypertension   Fatty liver disease, nonalcoholic   Gastro-esophageal reflux disease with esophagitis, without bleeding   Type II diabetes mellitus (HCC)   Traumatic rupture of right quadriceps tendon   Plan: Up with therapy Incentive Spirometry Elevate and Apply ice Monitoring lab values  Weightbearing: WBAT B/L LE. Keep B/L knees in extension. Do not allow knee flexion Insicional and dressing care: Dressings left intact until follow-up and Reinforce dressings as needed Orthopedic device(s): B/L KI Showering: Ok now but cover incision and keep dry VTE prophylaxis: Lovenox 40mg  qd while inpatient. Will switch to ASA 81mg  bid x 30 days once d/c, SCDs, ambulation Pain control: Continue current regimen Follow - up plan: 2 weeks Contact information:  Eulah Pont MD, 05/15/20 PA-C  Dispo: TBD. Looking into getting approval for CIR.  Continue working with PT/OT in the mean time    Office 05/16/2020, 8:27 AM

## 2020-05-16 NOTE — Progress Notes (Signed)
Occupational Therapy Treatment Patient Details Name: Arthur Ayers MRN: [id number] DOB: [birth year] Today's Date: 05/16/2020    History of present illness 55 y.o. male admitted on 05/12/20 for fall from truck with bil patellar tendon injury s/p bil quad tendon repair on day of admission.  Pt WBAT post op, no knee flexion ROM, KI bil at all times.  Pt with significant PMH of R shoulder surgery (RTC repair 2015), low back bulging disc, HTN.   OT comments  This 55 yo male admitted and underwent above presents to acute OT with making progress with 3n1 transfers today with focus shifting to anterior/posterior transfers for toileting instead of ambulating due to really difficult (requiring +2) for sit<>stand from 3n1 at highest height due to Bil KI. He will continue to benefit from acute OT with follow up now changed to CIR secondary to hopefully being able to get him to where he can mobilize more normally (especially for sit<>stand) and thus increase his independence with basic ADLs prior to going home.  Follow Up Recommendations  CIR;Supervision/Assistance - 24 hour    Equipment Recommendations  3 in 1 bedside commode;Hospital bed;Wheelchair (measurements OT);Wheelchair cushion (measurements OT);Other (comment) (elevating leg rests)       Precautions / Restrictions Precautions Precautions: Fall Required Braces or Orthoses: Knee Immobilizer - Right;Knee Immobilizer - Left Knee Immobilizer - Right: On at all times Knee Immobilizer - Left: On at all times Restrictions Weight Bearing Restrictions: Yes RLE Weight Bearing: Weight bearing as tolerated LLE Weight Bearing: Weight bearing as tolerated Other Position/Activity Restrictions: no knee flexion ROM       Mobility Bed Mobility Overal bed mobility: Needs Assistance Bed Mobility: Supine to Sit;Sit to Supine     Supine to sit: Min assist Sit to supine: Min assist   General bed mobility comments: A for legs    Transfers Overall  transfer level: Needs assistance Equipment used: None Transfers: Licensed conveyancer transfers: Min assist   General transfer comment: Pt initially needed min A for legs as he turned to align himself with 3n1; then I held 3n1 steady as he backed up onto it. In coming off of 3n1 I supported his legs    Balance Overall balance assessment: Needs assistance Sitting-balance support: No upper extremity supported;Feet supported Sitting balance-Leahy Scale: Fair                                     ADL either performed or assessed with clinical judgement   ADL Overall ADL's : Needs assistance/impaired           Upper Body Bathing Details (indicate cue type and reason): Educated pt and wife on washing his hair at sink once he gets home.   Lower Body Bathing Details (indicate cue type and reason): Educated pt and wife on washing LEs in bed so they could take KI off to wash legs and still keep legs straight; educated them on no rinse wash that is easier to use than soap.       Lower Body Dressing Details (indicate cue type and reason): I educated pt and wife on use of AE for LBD and made them aware of where to get them. Toilet Transfer: Minimal assistance;Anterior/posterior;BSC Statistician Details (indicate cue type and reason): Pt initially needed min A for legs as he turned to align himself with 3n1; then I held 3n1 steady as  he backed up onto it. In coming off of 3n1 I supported his legs                 Vision Patient Visual Report: No change from baseline            Cognition Arousal/Alertness: Awake/alert Behavior During Therapy: WFL for tasks assessed/performed Overall Cognitive Status: Within Functional Limits for tasks assessed                                                     Pertinent Vitals/ Pain       Pain Assessment: Faces Pain Score: 5  Pain Location: bil knees with moving legs  around Pain Descriptors / Indicators: Grimacing;Guarding Pain Intervention(s): Limited activity within patient's tolerance;Monitored during session;Repositioned         Frequency  Min 2X/week        Progress Toward Goals  OT Goals(current goals can now be found in the care plan section)  Progress towards OT goals: Progressing toward goals  Acute Rehab OT Goals Patient Stated Goal: to go to rehab then home OT Goal Formulation: With patient/family Time For Goal Achievement: 05/28/20 Potential to Achieve Goals: Good  Plan Discharge plan needs to be updated       AM-PAC OT "6 Clicks" Daily Activity     Outcome Measure   Help from another person eating meals?: None Help from another person taking care of personal grooming?: A Little Help from another person toileting, which includes using toliet, bedpan, or urinal?: A Little Help from another person bathing (including washing, rinsing, drying)?: A Lot Help from another person to put on and taking off regular upper body clothing?: A Little Help from another person to put on and taking off regular lower body clothing?: Total 6 Click Score: 16    End of Session Equipment Utilized During Treatment: Right knee immobilizer;Left knee immobilizer  OT Visit Diagnosis: Unsteadiness on feet (R26.81);Muscle weakness (generalized) (M62.81);Pain Pain - Right/Left:  (both) Pain - part of body: Leg   Activity Tolerance Patient tolerated treatment well   Patient Left in bed;with call bell/phone within reach;with family/visitor present   Nurse Communication Mobility status (and NT as well for anterior/posterior transfers to/from 3n1 at bedside)        Time: 2376-2831 OT Time Calculation (min): 37 min  Charges: OT General Charges $OT Visit: 1 Visit OT Treatments $Self Care/Home Management : 23-37 mins  Ignacia Palma, OTR/L Acute [employer] Pager [phone] Office [phone]      Evette Georges 05/16/2020,  10:42 AM

## 2020-05-16 NOTE — Progress Notes (Signed)
Physical Therapy Treatment Patient Details Name: Arthur Ayers MRN: [id number] DOB: 1966-02-14 Today's Date: 05/16/2020    History of Present Illness 55 y.o. male admitted on 05/12/20 for fall from truck with bil patellar tendon injury s/p bil quad tendon repair on day of admission.  Pt WBAT post op, no knee flexion ROM, KI bil at all times.  Pt with significant PMH of R shoulder surgery (RTC repair 2015), low back bulging disc, HTN.    PT Comments    Pt was able to practice lateral transfer to drop arm recliner chair simulating lateral WC transfer.  PT assisted with leg management on the way out of bed and wife on the way back to bed.  Wife also measured their stairs to enter the home and to get up to the bedroom and they are 7".  The stairs we practiced yesterday were 4", so I now believe it is not realistic for him to stand to get up and down the stairs to enter his home or to sleep upstairs at this time.  I encouraged ramp rental and we may be looking more towards WC level transfers and mobility. He will also likely need a hospital bed for downstairs as his choices are a low recliner and low couch for sleeping. PT will continue to follow acutely for safe mobility progression.  Bring a real WC with elevating leg rests next session to practice lateral transfers again.    Follow Up Recommendations  CIR     Equipment Recommendations  Rolling walker with 5" wheels;Wheelchair (measurements PT);Wheelchair cushion (measurements PT);Hospital bed (ramp rental, 18x18 WC with bil elevating leg rests.)    Recommendations for Other Services Rehab consult     Precautions / Restrictions Precautions Precautions: Fall Required Braces or Orthoses: Knee Immobilizer - Right;Knee Immobilizer - Left Knee Immobilizer - Right: On at all times Knee Immobilizer - Left: On at all times Restrictions Weight Bearing Restrictions: Yes RLE Weight Bearing: Weight bearing as tolerated LLE Weight Bearing: Weight  bearing as tolerated Other Position/Activity Restrictions: no knee flexion ROM    Mobility  Bed Mobility Overal bed mobility: Needs Assistance Bed Mobility: Supine to Sit (supine to long sitting.)     Supine to sit: Modified independent (Device/Increase time);HOB elevated Sit to supine: Modified independent (Device/Increase time);HOB elevated (long sitting to supine)   General bed mobility comments: Came up to long sitting from elevated HOB and returned from long sitting to supine with elevated HOB.  Wife and PT assisting (PT on the way up, wife on the way down) with bil legs.    Transfers Overall transfer level: Needs assistance Equipment used: None Transfers: Lateral/Scoot Transfers       Anterior-Posterior transfers: Min assist  Lateral/Scoot Transfers: Mod assist;From elevated surface General transfer comment: Focus of session was to attempt to see if lateral transfer to [facility] may be a better option for pt until he is able to use legs without KIs.  He was able to come to long sitting well, needed assist of one for management of bil LEs and could scoot with extra time and effort with mod assist mostly to hold legs up.  PT assisted on the way out of bed to drop arm recliner chair, wife assisted on the way back to bed from drop arm recliner back to bed.  Bed was adjusted so that it was a high to low transfer both directions.  Next session practice with a real WC.  Ambulation/Gait  Stairs             Wheelchair Mobility    Modified Rankin (Stroke Patients Only)       Balance Overall balance assessment: Needs assistance Sitting-balance support: Feet supported;Bilateral upper extremity supported Sitting balance-Leahy Scale: Fair                                      Cognition Arousal/Alertness: Awake/alert Behavior During Therapy: WFL for tasks assessed/performed Overall Cognitive Status: Within Functional Limits for tasks  assessed                                 General Comments: a bit groggy today, but wife reports he just got some pain meds and ate a good lunch.      Exercises Total Joint Exercises Ankle Circles/Pumps: AROM;Both;10 reps Hip ABduction/ADduction: AAROM;Both;10 reps;Other (comment) (used sheet to assist) Straight Leg Raises: AAROM;Both;10 reps    General Comments General comments (skin integrity, edema, etc.): spoke about CIR consult recommendation from CIR coordinator.  Also asked how pt's bottom was doing as far as pain and pressure as when he sat EOB I was behind him and it was quite pink. He said that he did get some uncomfortableness in this area frequently and I spoke re: repositioning to avoid getting a wound back there.  We also spoke about using ice to help with pain once his ace wraps are taken off of bil legs.  He is wrapped so thickly right now that he would not feel ice if we put it on.  Pt's wife went home and measured their steps in the house yesterday and she reports they are 7" high.  The steps we practiced with difficulty yesterday were 4" high. I do not think doing stairs is going to be a reasonable expectation at this time, so I encouraged his wife to go ahead and look into ramp rental for home entry, which also may mean he needs a hospital bed to be able to stay downstairs.      Pertinent Vitals/Pain Pain Assessment: Faces Pain Score: 5  Faces Pain Scale: Hurts even more Pain Location: bil knees with moving legs around Pain Descriptors / Indicators: Grimacing;Guarding Pain Intervention(s): Limited activity within patient's tolerance;Monitored during session;Premedicated before session;Repositioned (encouraged icing when ace wraps come off (he is too heavily wrapped right now to feel ice).)    Home Living                      Prior Function            PT Goals (current goals can now be found in the care plan section) Acute Rehab PT  Goals Patient Stated Goal: to go to rehab then home Progress towards PT goals: Progressing toward goals    Frequency    Min 5X/week      PT Plan Current plan remains appropriate    Co-evaluation              AM-PAC PT "6 Clicks" Mobility   Outcome Measure  Help needed turning from your back to your side while in a flat bed without using bedrails?: A Little Help needed moving from lying on your back to sitting on the side of a flat bed without using bedrails?: None Help needed moving to and from a bed  to a chair (including a wheelchair)?: A Lot Help needed standing up from a chair using your arms (e.g., wheelchair or bedside chair)?: A Lot Help needed to walk in hospital room?: A Little Help needed climbing 3-5 steps with a railing? : A Lot 6 Click Score: 16    End of Session Equipment Utilized During Treatment: Right knee immobilizer;Left knee immobilizer   Patient left: in bed;with call bell/phone within reach;with family/visitor present Nurse Communication: Mobility status PT Visit Diagnosis: Muscle weakness (generalized) (M62.81);Difficulty in walking, not elsewhere classified (R26.2);Pain Pain - Right/Left: Right (bil) Pain - part of body: Knee (bil knees)     Time: 0768-0881 PT Time Calculation (min) (ACUTE ONLY): 30 min  Charges:  $Therapeutic Exercise: 8-22 mins $Therapeutic Activity: 8-22 mins                     Corinna Capra, PT, DPT  Acute Rehabilitation [phone] pager [phone]) [phone] office

## 2020-05-16 NOTE — TOC Initial Note (Incomplete Revision)
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    From home with wife, Arthur Ayers . PTA independent with ADL's, no DME usage.  Arthur Ayers (Spouse)     [phone]      Per PT's recent eval/recommdations: CIR. NCM called and left voice message requesting IR consult.   NCM faxed clinicals to [facility] / Arthur Ayers ( covering adjuster) @  [phone], contact #  [phone].....  TOC team will continue to monitor and assist with TOC needs.   4/4 1410 NCM  called Arthur Ayers  / [employer]   Barriers to Discharge: Continued Medical Work up   Patient Goals and CMS Choice        Expected Discharge Plan and Services         Living arrangements for the past 2 months: Single Family Home                                      Prior Living Arrangements/Services Living arrangements for the past 2 months: Single Family Home Lives with:: Spouse Patient language and need for interpreter reviewed:: Yes Do you feel safe going back to the place where you live?: Yes      Need for Family Participation in Patient Care: Yes (Comment) Care giver support system in place?: Yes (comment)   Criminal Activity/Legal Involvement Pertinent to Current Situation/Hospitalization: No - Comment as needed  Activities of Daily Living Home Assistive Devices/Equipment: None ADL Screening (condition at time of admission) Patient's cognitive ability adequate to safely complete daily activities?: No Is the patient deaf or have difficulty hearing?: No Does the patient have difficulty seeing, even when wearing  glasses/contacts?: No Does the patient have difficulty concentrating, remembering, or making decisions?: No Patient able to express need for assistance with ADLs?: Yes Does the patient have difficulty dressing or bathing?: No Independently performs ADLs?: Yes (appropriate for developmental age) Does the patient have difficulty walking or climbing stairs?: Yes Weakness of Legs: Both Weakness of Arms/Hands: None  Permission Sought/Granted   Permission granted to share information with : Yes, Verbal Permission Granted              Emotional Assessment Appearance:: Appears stated age Attitude/Demeanor/Rapport: Engaged   Orientation: : Oriented to Self,Oriented to Place,Oriented to  Time,Oriented to Situation Alcohol / Substance Use: Not Applicable Psych Involvement: No (comment)  Admission diagnosis:  Patellar tendon rupture [S86.819A] Fall [W19.XXXA] Rupture of left patellar tendon, initial encounter [S86.812A] Rupture of right quadriceps muscle, initial encounter [W54.627O] Patient Active Problem List   Diagnosis Date Noted  . Traumatic rupture of right quadriceps tendon 05/15/2020  . Acute pain of right knee 05/13/2020  . Traumatic rupture of left quadriceps tendon 05/12/2020  . Class 1 obesity due to excess calories without serious comorbidity with body mass index (BMI) of 31.0 to 31.9 in adult 02/27/2019  . Osteoarthritis of right knee 02/20/2018  . Fatty liver disease, nonalcoholic 11/25/2016  . CKD (chronic kidney disease) stage 2, GFR 60-89 ml/min 02/18/2015  .  Elevated fasting blood sugar 02/18/2015  . Elevation of level of transaminase or lactic acid dehydrogenase (LDH) 02/18/2015  . Essential hypertension 02/18/2015  . Gastro-esophageal reflux disease with esophagitis, without bleeding 02/18/2015  . Mixed hyperlipidemia 02/18/2015  . Esophageal reflux 07/22/2012  . Type II diabetes mellitus (HCC) 07/22/2012   PCP:  Ayers, Arthur M., MD Pharmacy:   [facility] -  [place], [place] - [address] [place] [address] Phone: [phone] Fax: [phone]     Social Determinants of Health (SDOH) Interventions    Readmission Risk Interventions No flowsheet data found.  

## 2020-05-17 NOTE — Progress Notes (Signed)
Physical Therapy Treatment Patient Details Name: Arthur Ayers MRN: [id number] DOB: 01-17-1966 Today's Date: 05/17/2020    History of Present Illness 55 y.o. male admitted on 05/12/20 for fall from truck with bil patellar tendon injury s/p bil quad tendon repair on day of admission.  Pt WBAT post op, no knee flexion ROM, KI bil at all times.  Pt with significant PMH of R shoulder surgery (RTC repair 2015), low back bulging disc, HTN.    PT Comments    Pt is progressing well towards goals and very motivated to recover. Today's session focused on WC mobilities. Pt was able to progress OOB to WC via sliding board transfer. He self propelled around unit before returning to room to practice gait. Pt with very slow guarded gait secondary to pain, but he is able to push through the pain to participate. Pt remains an excellent candidate for CIR. Will continue to follow acutely.     Follow Up Recommendations  CIR     Equipment Recommendations  Rolling walker with 5" wheels;Wheelchair (measurements PT);Wheelchair cushion (measurements PT);Hospital bed (ramp rental, 18x18 WC with bil elevating leg rests.)    Recommendations for Other Services Rehab consult     Precautions / Restrictions Precautions Precautions: Fall Required Braces or Orthoses: Knee Immobilizer - Right;Knee Immobilizer - Left Knee Immobilizer - Right: On at all times Knee Immobilizer - Left: On at all times Restrictions Weight Bearing Restrictions: Yes RLE Weight Bearing: Weight bearing as tolerated LLE Weight Bearing: Weight bearing as tolerated Other Position/Activity Restrictions: no knee flexion ROM    Mobility  Bed Mobility Overal bed mobility: Needs Assistance Bed Mobility: Supine to Sit (supine to long sitting.)     Supine to sit: Modified independent (Device/Increase time);HOB elevated     General bed mobility comments: able to come to long sitting w/o assist. Assist with LEs to pivot in bed.     Transfers Overall transfer level: Needs assistance Equipment used: Sliding board Transfers: Lateral/Scoot Transfers;Anterior-Posterior Transfer Sit to Stand: Min assist;From elevated surface     Anterior-Posterior transfers: Min assist  Lateral/Scoot Transfers: Min assist;With slide board;From elevated surface General transfer comment: assist given for LE management on AP transfer and lateral scoots. VC for hand placement and safety. Pt able to stand from EOB with bed significantly raised to prevent knee flexion. SBT to WC, AP transfer to bed, sit>stand for gait.  Ambulation/Gait Ambulation/Gait assistance: Min assist Gait Distance (Feet): 15 Feet Assistive device: Rolling walker (2 wheeled) Gait Pattern/deviations: Step-to pattern;Trunk flexed Gait velocity: decreased Gait velocity interpretation: <1.31 ft/sec, indicative of household ambulator General Gait Details: Pt with flexed trunk due to pain and step thru pattern. Slow guarded gait.   Stairs             Merchant navy officer mobility: Yes Wheelchair propulsion: Both upper extremities Wheelchair parts: Needs assistance Distance: 260 Wheelchair Assistance Details (indicate cue type and reason): Pt introduced to WC parts and functions. Locking breaks with cues. Propelling slowly, cues required for turns and arm positioning for propelling. BIL legs elevated on leg rests with pillows.  Modified Rankin (Stroke Patients Only)       Balance Overall balance assessment: Needs assistance Sitting-balance support: Feet supported;Bilateral upper extremity supported Sitting balance-Leahy Scale: Fair     Standing balance support: Bilateral upper extremity supported Standing balance-Leahy Scale: Poor Standing balance comment: heavily reliant on bil UE on RW.  Cognition Arousal/Alertness: Awake/alert Behavior During Therapy: WFL for tasks  assessed/performed Overall Cognitive Status: Within Functional Limits for tasks assessed                                        Exercises      General Comments        Pertinent Vitals/Pain Pain Assessment: 0-10 Pain Score: 6  Pain Location: bil knees with moving legs around Pain Descriptors / Indicators: Grimacing;Guarding Pain Intervention(s): Monitored during session;Limited activity within patient's tolerance;Repositioned;Patient requesting pain meds-RN notified    Home Living   Living Arrangements: Spouse/significant other;Children Available Help at Discharge: Family;Available 24 hours/day Type of Home: House Home Access: Stairs to enter Entrance Stairs-Rails: None Home Layout: Multi-level;1/2 bath on main level        Prior Function            PT Goals (current goals can now be found in the care plan section) Acute Rehab PT Goals Patient Stated Goal: to go to rehab then home Progress towards PT goals: Progressing toward goals    Frequency    Min 5X/week      PT Plan Current plan remains appropriate    Co-evaluation              AM-PAC PT "6 Clicks" Mobility   Outcome Measure  Help needed turning from your back to your side while in a flat bed without using bedrails?: A Little Help needed moving from lying on your back to sitting on the side of a flat bed without using bedrails?: None Help needed moving to and from a bed to a chair (including a wheelchair)?: A Lot Help needed standing up from a chair using your arms (e.g., wheelchair or bedside chair)?: A Lot Help needed to walk in hospital room?: A Little Help needed climbing 3-5 steps with a railing? : A Lot 6 Click Score: 16    End of Session Equipment Utilized During Treatment: Right knee immobilizer;Left knee immobilizer;Gait belt Activity Tolerance: Patient tolerated treatment well Patient left: with call bell/phone within reach;with family/visitor present;in  chair Nurse Communication: Mobility status PT Visit Diagnosis: Muscle weakness (generalized) (M62.81);Difficulty in walking, not elsewhere classified (R26.2);Pain Pain - Right/Left: Right (bil) Pain - part of body: Knee (bil knees)     Time: 3903-0092 PT Time Calculation (min) (ACUTE ONLY): 51 min  Charges:  $Gait Training: 8-22 mins $Wheel Chair Management: 23-37 mins                     Kallie Locks, [place] Pager [phone] Acute Rehab   Sheral Apley 05/17/2020, 2:55 PM

## 2020-05-17 NOTE — Progress Notes (Signed)
Orthopaedic Trauma Service Progress Note Weekend Coverage   Patient ID: Arthur Ayers MRN: [id number] DOB/AGE: 1965-07-26 55 y.o.  Subjective:  No complaints Pain tolerable, had some meds an hour ago. Now a 2-3/10  ROS As above  Objective:   VITALS:   Vitals:   05/16/20 0849 05/16/20 1535 05/16/20 2100 05/17/20 0803  BP: 123/72 133/67 130/68 137/69  Pulse: 82 86 80 91  Resp: 16 16 16 17   Temp: 97.6 F (36.4 C) 99.5 F (37.5 C) 99 F (37.2 C) 97.6 F (36.4 C)  TempSrc: Oral Oral Oral Oral  SpO2: 96% 96% 98% 97%  Weight:      Height:        Estimated body mass index is 32.49 kg/m as calculated from the following:   Height as of this encounter: 5\' 9"  (1.753 m).   Weight as of this encounter: 99.8 kg.   Intake/Output      04/01 0701 04/02 0700 04/02 0701 04/03 0700   P.O. 720    Total Intake(mL/kg) 720 (7.2)    Urine (mL/kg/hr) 3900 (1.6) 1375 (2.8)   Total Output 3900 1375   Net -3180 -1375          LABS  No results found for this or any previous visit (from the past 24 hour(s)).   PHYSICAL EXAM:   Gen: looks good, NAD Lungs: unlabored Cardiac: regular  Ext:       B LEx  Knee immobilizers intact  exts warm  Dressings c/d/i  Swelling well controlled  Distal motor and sensory functions intact   Assessment/Plan: 4 Days Post-Op   Principal Problem:   Traumatic rupture of left quadriceps tendon Active Problems:   Acute pain of right knee   Esophageal reflux   Essential hypertension   Fatty liver disease, nonalcoholic   Gastro-esophageal reflux disease with esophagitis, without bleeding   Type II diabetes mellitus (HCC)   Traumatic rupture of right quadriceps tendon   Anti-infectives (From admission, onward)   Start     Dose/Rate Route Frequency Ordered Stop   05/14/20 0600  ceFAZolin (ANCEF) IVPB 2g/100 mL premix        2 g 200 mL/hr over 30 Minutes Intravenous  On call to O.R. 05/13/20 1226 05/14/20 0601   05/13/20 2100  ceFAZolin (ANCEF) IVPB 2g/100 mL premix        2 g 200 mL/hr over 30 Minutes Intravenous Every 6 hours 05/13/20 1753 05/14/20 0901   05/13/20 1245  ceFAZolin (ANCEF) 2-4 GM/100ML-% IVPB       Note to Pharmacy: 05/16/20, [name]   : cabinet override      05/13/20 1245 05/13/20 1455    .  POD/HD#: 32  55 y/o male s/p B quad tendon repair   Weightbearing: WBAT B/L LE. Keep B/L knees in extension. Do not allow knee flexion Insicional and dressing care: Dressings left intact until follow-up and Reinforce dressings as needed Orthopedic device(s): B/L KI Showering: Ok now but cover incision and keep dry VTE prophylaxis: Lovenox 40mg  qd while inpatient. Will switch to ASA 81mg  bid x 30 days once d/c, SCDs, ambulation Pain control: Continue current regimen Follow - up plan: 2 weeks Contact information:  10 MD, 44 PA-C  Dispo: TBD. Looking into getting approval for CIR.  Continue working with PT/OT  in the mean time    Mearl Latin, PA-C [phone] (C) 05/17/2020, 11:58 AM  [facility] [address] [place] [address] [phone] [name] (F)    After 5pm and on the weekends please log on to Amion, go to orthopaedics and the look under the [facility] Call for the provider(s) on call. You can also call our office at [phone] and then follow the prompts to be connected to the call team.

## 2020-05-17 NOTE — Progress Notes (Signed)
Inpatient Rehab Admissions:  Inpatient Rehab Consult received.  I met with patient at the bedside for rehabilitation assessment and to discuss goals and expectations of an inpatient rehab admission.  Pt acknowledged understanding. Pt interested in pursuing CIR.  Will continue to follow.  Signed: Gayland Curry, [place], [place] Admissions Coordinator [phone]

## 2020-05-17 NOTE — Plan of Care (Signed)

## 2020-05-18 LAB — COMPREHENSIVE METABOLIC PANEL
ALT: 35 U/L (ref 0–44)
AST: 39 U/L (ref 15–41)
Albumin: 2.4 g/dL — ABNORMAL LOW (ref 3.5–5.0)
Alkaline Phosphatase: 53 U/L (ref 38–126)
Anion gap: 8 (ref 5–15)
BUN: 13 mg/dL (ref 6–20)
CO2: 22 mmol/L (ref 22–32)
Calcium: 8.1 mg/dL — ABNORMAL LOW (ref 8.9–10.3)
Chloride: 100 mmol/L (ref 98–111)
Creatinine, Ser: 0.91 mg/dL (ref 0.61–1.24)
GFR, Estimated: >60 mL/min (ref >60)
Glucose, Bld: 186 mg/dL — ABNORMAL HIGH (ref 70–99)
Potassium: 4 mmol/L (ref 3.5–5.1)
Sodium: 130 mmol/L — ABNORMAL LOW (ref 135–145)
Total Bilirubin: 0.7 mg/dL (ref 0.3–1.2)
Total Protein: 5.9 g/dL — ABNORMAL LOW (ref 6.5–8.1)

## 2020-05-18 LAB — CBC
HCT: 33.3 % — ABNORMAL LOW (ref 39.0–52.0)
Hemoglobin: 11.2 g/dL — ABNORMAL LOW (ref 13.0–17.0)
MCH: 30.3 pg (ref 26.0–34.0)
MCHC: 33.6 g/dL (ref 30.0–36.0)
MCV: 90 fL (ref 80.0–100.0)
Platelets: 299 10*3/uL (ref 150–400)
RBC: 3.7 MIL/uL — ABNORMAL LOW (ref 4.22–5.81)
RDW: 12.3 % (ref 11.5–15.5)
WBC: 13.8 10*3/uL — ABNORMAL HIGH (ref 4.0–10.5)
nRBC: 0 % (ref 0.0–0.2)

## 2020-05-18 MED ORDER — ADULT MULTIVITAMIN W/MINERALS CH
1.0000 | ORAL_TABLET | Freq: Every day | ORAL | Status: DC
  Administered 2020-05-18 – 2020-05-22 (×5): 1 via ORAL
  Filled 2020-05-18 (×5): qty 1

## 2020-05-18 MED ORDER — SODIUM CHLORIDE 0.9 % IV SOLN: INTRAVENOUS | Status: DC

## 2020-05-18 NOTE — Plan of Care (Signed)

## 2020-05-18 NOTE — Progress Notes (Signed)
.    Subjective: Patient reports pain as mild to moderate. Slept off an on last night. Tolerating diet. Urinating. No CP, SOB. Working with PT/OT on mobilizing OOB. Says his bottom feels sore and uncomfortable from sitting in the bed and chair all day. Also say the KI on the left side is digging into the back of his left thigh.   Objective:   VITALS:   Vitals:   05/17/20 1453 05/17/20 1900 05/18/20 0300 05/18/20 0833  BP: 120/70 133/69 (!) 145/65 113/60  Pulse: 96 89 90 82  Resp: 16 16 17 16   Temp: 98.6 F (37 C) 98.8 F (37.1 C) 98 F (36.7 C) 98.7 F (37.1 C)  TempSrc: Oral Oral Oral Oral  SpO2: 95% 98% 97% 95%  Weight:      Height:       CBC Latest Ref Rng & Units 05/18/2020 05/15/2020 05/14/2020  WBC 4.0 - 10.5 K/uL 13.8(H) 16.8(H) 16.4(H)  Hemoglobin 13.0 - 17.0 g/dL 11.2(L) 12.1(L) 11.9(L)  Hematocrit 39.0 - 52.0 % 33.3(L) 37.2(L) 36.2(L)  Platelets 150 - 400 K/uL 299 249 259   BMP Latest Ref Rng & Units 05/18/2020 05/16/2020 05/15/2020  Glucose 70 - 99 mg/dL 05/17/2020) 166(A) 630(Z)  BUN 6 - 20 mg/dL 13 14 14   Creatinine 0.61 - 1.24 mg/dL 601(U 9.32  Sodium 135 - 145 mmol/L 130(L) 134(L) 132(L)  Potassium 3.5 - 5.1 mmol/L 4.0 4.1 4.0  Chloride 98 - 111 mmol/L 100 103 100  CO2 22 - 32 mmol/L 22 26 27   Calcium 8.9 - 10.3 mg/dL 8.1(L) 8.1(L) 8.0(L)   Intake/Output      04/02 0701 04/03 0700 04/03 0701 04/04 0700   P.O.     Total Intake(mL/kg)     Urine (mL/kg/hr) 3575 (1.5)    Total Output 3575    Net -3575         Urine Occurrence 1 x       Physical Exam: General: NAD. Sitting up in bed eating breakfast.  Resp: No increased wob Cardio: regular rate and rhythm ABD soft Neurologically intact MSK Neurovascularly intact Sensation intact distally Intact pulses distally Dorsiflexion/Plantar flexion intact Incision: dressing C/D/I, B/L KI in place    Assessment: 5 Days Post-Op  S/P Procedure(s) (LRB): REPAIR QUADRICEP TENDON (Bilateral) by Dr. 06/03.  06/03 on 05/13/20  Principal Problem:   Traumatic rupture of left quadriceps tendon Active Problems:   Acute pain of right knee   Esophageal reflux   Essential hypertension   Fatty liver disease, nonalcoholic   Gastro-esophageal reflux disease with esophagitis, without bleeding   Type II diabetes mellitus (HCC)   Traumatic rupture of right quadriceps tendon   Plan: Up with therapy  Adjusted KI and padded the thigh and the buttocks area Incentive Spirometry Elevate and Apply ice Monitoring lab values. Going to add multivitamin   Weightbearing: WBAT B/L LE. Keep B/L knees in extension. Do not allow knee flexion Insicional and dressing care: Dressings left intact until follow-up and Reinforce dressings as needed Orthopedic device(s): B/L KI Showering: Ok now but cover incision and keep dry VTE prophylaxis: Lovenox 40mg  qd while inpatient. Will switch to ASA 81mg  bid x 30 days once d/c, SCDs, ambulation Pain control: Continue current regimen Follow - up plan: 7-10 days after d/c Contact information:  Jewel Baize MD, Eulah Pont PA-C  Dispo: TBD. Looking into getting approval for CIR.  Continue working with PT/OT in the mean time to increased skills and avoid pressure sores.  Jenne Pane, PA-C Office [phone] 05/18/2020, 9:49 AM

## 2020-05-18 NOTE — Progress Notes (Signed)
Orthopedic Tech Progress Note Patient Details:  Arthur Ayers Jan 24, 1966 [id number] Ordered Bilateral Knee Braces for pt Patient ID: Arthur Ayers, male   DOB: 1965/12/09, 55 y.o.   MRN: [id number]   Gerald Stabs 05/18/2020, 4:38 PM

## 2020-05-19 LAB — COMPREHENSIVE METABOLIC PANEL
ALT: 32 U/L (ref 0–44)
AST: 30 U/L (ref 15–41)
Albumin: 2.3 g/dL — ABNORMAL LOW (ref 3.5–5.0)
Alkaline Phosphatase: 51 U/L (ref 38–126)
Anion gap: 8 (ref 5–15)
BUN: 11 mg/dL (ref 6–20)
CO2: 23 mmol/L (ref 22–32)
Calcium: 7.9 mg/dL — ABNORMAL LOW (ref 8.9–10.3)
Chloride: 99 mmol/L (ref 98–111)
Creatinine, Ser: 0.79 mg/dL (ref 0.61–1.24)
GFR, Estimated: >60 mL/min (ref >60)
Glucose, Bld: 158 mg/dL — ABNORMAL HIGH (ref 70–99)
Potassium: 4.1 mmol/L (ref 3.5–5.1)
Sodium: 130 mmol/L — ABNORMAL LOW (ref 135–145)
Total Bilirubin: 0.8 mg/dL (ref 0.3–1.2)
Total Protein: 5.7 g/dL — ABNORMAL LOW (ref 6.5–8.1)

## 2020-05-19 LAB — CBC WITH DIFFERENTIAL/PLATELET
Abs Immature Granulocytes: 0.59 10*3/uL — ABNORMAL HIGH (ref 0.00–0.07)
Basophils Absolute: 0.1 10*3/uL (ref 0.0–0.1)
Basophils Relative: 1 %
Eosinophils Absolute: 0.3 10*3/uL (ref 0.0–0.5)
Eosinophils Relative: 2 %
HCT: 34.4 % — ABNORMAL LOW (ref 39.0–52.0)
Hemoglobin: 11.5 g/dL — ABNORMAL LOW (ref 13.0–17.0)
Immature Granulocytes: 4 %
Lymphocytes Relative: 14 %
Lymphs Abs: 2.1 10*3/uL (ref 0.7–4.0)
MCH: 29.9 pg (ref 26.0–34.0)
MCHC: 33.4 g/dL (ref 30.0–36.0)
MCV: 89.6 fL (ref 80.0–100.0)
Monocytes Absolute: 1.5 10*3/uL — ABNORMAL HIGH (ref 0.1–1.0)
Monocytes Relative: 10 %
Neutro Abs: 9.9 10*3/uL — ABNORMAL HIGH (ref 1.7–7.7)
Neutrophils Relative %: 69 %
Platelets: 275 10*3/uL (ref 150–400)
RBC: 3.84 MIL/uL — ABNORMAL LOW (ref 4.22–5.81)
RDW: 12.3 % (ref 11.5–15.5)
WBC: 14.4 10*3/uL — ABNORMAL HIGH (ref 4.0–10.5)
nRBC: 0 % (ref 0.0–0.2)

## 2020-05-19 MED ORDER — ENOXAPARIN SODIUM 40 MG/0.4ML ~~LOC~~ SOLN
40.0000 mg | SUBCUTANEOUS | Status: DC
  Administered 2020-05-19 – 2020-05-21 (×3): 40 mg via SUBCUTANEOUS
  Filled 2020-05-19 (×4): qty 0.4

## 2020-05-19 MED FILL — Fentanyl Citrate Preservative Free (PF) Inj 100 MCG/2ML: INTRAMUSCULAR | Qty: 2 | Status: AC

## 2020-05-19 NOTE — Progress Notes (Signed)
    Subjective: Patient reports pain as mild to moderate. Controlled with medicines. Tolerating diet. Urinating. No CP, SOB.  Working with PT/OT on mobilizing OOB. Saying he gets fatigued quickly when working with them.   Objective:   VITALS:   Vitals:   05/18/20 0833 05/18/20 1500 05/18/20 1945 05/19/20 0500  BP: 113/60 119/62 138/74 124/78  Pulse: 82 95 100 75  Resp: 16 17 15 16   Temp: 98.7 F (37.1 C) 98.7 F (37.1 C) 100.3 F (37.9 C) 97.8 F (36.6 C)  TempSrc: Oral Oral Oral Oral  SpO2: 95% 96% 98% 94%  Weight:      Height:       CBC Latest Ref Rng & Units 05/19/2020 05/18/2020 05/15/2020  WBC 4.0 - 10.5 K/uL 14.4(H) 13.8(H) 16.8(H)  Hemoglobin 13.0 - 17.0 g/dL 11.5(L) 11.2(L) 12.1(L)  Hematocrit 39.0 - 52.0 % 34.4(L) 33.3(L) 37.2(L)  Platelets 150 - 400 K/uL 275 299 249   BMP Latest Ref Rng & Units 05/19/2020 05/18/2020 05/16/2020  Glucose 70 - 99 mg/dL 07/16/2020) 408(X) 448(J)  BUN 6 - 20 mg/dL 11 13 14   Creatinine 0.61 - 1.24 mg/dL 856(D 1.49  Sodium 135 - 145 mmol/L 130(L) 130(L) 134(L)  Potassium 3.5 - 5.1 mmol/L 4.1 4.0 4.1  Chloride 98 - 111 mmol/L 99 100 103  CO2 22 - 32 mmol/L 23 22 26   Calcium 8.9 - 10.3 mg/dL 7.9(L) 8.1(L) 8.1(L)   Intake/Output      04/03 0701 04/04 0700 04/04 0701 04/05 0700   Urine (mL/kg/hr) 3350 (1.4) 600 (4.2)   Stool 0    Total Output 3350 600   Net -3350 -600        Stool Occurrence 1 x       Physical Exam: General: NAD. Laying in bed. Calm Resp: No increased wob Cardio: regular rate and rhythm ABD soft Neurologically intact MSK Neurovascularly intact Sensation intact distally Intact pulses distally Dorsiflexion/Plantar flexion intact Incision: dressing C/D/I, B/L Bledsoe knee braces locked in extension   Assessment: 6 Days Post-Op  S/P Procedure(s) (LRB): REPAIR QUADRICEP TENDON (Bilateral) by Dr. 06/04. 06/04 on 05/13/20  Principal Problem:   Traumatic rupture of left quadriceps tendon Active Problems:    Acute pain of right knee   Esophageal reflux   Essential hypertension   Fatty liver disease, nonalcoholic   Gastro-esophageal reflux disease with esophagitis, without bleeding   Type II diabetes mellitus (HCC)   Traumatic rupture of right quadriceps tendon   Plan: Work on Jewel Baize Up with therapy Incentive Spirometry Elevate and Apply ice Continuing to monitor lab values, appear stable for now, patient denies and associated S&S  Weightbearing: WBAT B/L LE. Keep in extension. No knee flexion Insicional and dressing care: Dressings left intact until follow-up and Reinforce dressings as needed Orthopedic device(s): B/L Bledsoe knee braces Showering: Keep dressing dry VTE prophylaxis: Lovenox 40mg  qd while in patient. Will switch to ASA on d/c, SCDs, ambulation Pain control: Continue current regimen Follow - up plan: 1 week in the office after d/c Contact information:  Eulah Pont MD, 05/15/20 PA-C  Dispo: TBD. Waiting on CIR approval    Scientist, clinical (histocompatibility and immunogenetics) Office 05/19/2020, 8:26 AM

## 2020-05-19 NOTE — Plan of Care (Signed)

## 2020-05-19 NOTE — Progress Notes (Signed)
Inpatient Rehab Admissions Coordinator:  Saw pt at bedside. Informed him that we are awaiting approval from worker's comp. Faxed clinicals x2 today as requested by [name] the claims adjuster.   Will continue to follow.   Wolfgang Phoenix, MS, CCC-SLP Admissions Coordinator [phone]

## 2020-05-19 NOTE — Progress Notes (Signed)
Occupational Therapy Treatment Patient Details Name: Arthur Ayers MRN: [id number] DOB: 1965-10-25 Today's Date: 05/19/2020    History of present illness 55 y.o. male admitted on 05/12/20 for fall from truck with bil patellar tendon injury s/p bil quad tendon repair on day of admission.  Pt WBAT post op, no knee flexion ROM, KI bil at all times.  Pt with significant PMH of R shoulder surgery (RTC repair 2015), low back bulging disc, HTN.   OT comments  Pt demonstrating increased activity tolerance during this session. Pt was able to ambulate in the room w/ a RW and min guard. Additionally pt was able to complete 2 sit/stand transfers from an elevated bed and an elevated toilet w/ mod A for safety. PT assisted w/ ensuring pt maintained extension in Bilateral knees and placing equipment under BLE when needed during toileting. Pt able to tolerate standing at sink for 2 mins with supervision. CIR is working with Pt on placement. Until d/c'd pt will continue to benefit from acute OT services to address the following concerns.    Follow Up Recommendations  CIR;Supervision/Assistance - 24 hour    Equipment Recommendations  3 in 1 bedside commode;Hospital bed;Wheelchair (measurements OT);Wheelchair cushion (measurements OT);Other (comment)    Recommendations for Other Services      Precautions / Restrictions Precautions Precautions: Fall;Knee Precaution Booklet Issued: No Precaution Comments: Pt unable to flex knees, must wear bilateral bledsoe braces at all times Required Braces or Orthoses: Knee Immobilizer - Right;Knee Immobilizer - Left Knee Immobilizer - Right: On at all times Knee Immobilizer - Left: On at all times Restrictions Weight Bearing Restrictions: Yes RLE Weight Bearing: Weight bearing as tolerated LLE Weight Bearing: Weight bearing as tolerated Other Position/Activity Restrictions: no knee flexion ROM       Mobility Bed Mobility Overal bed mobility: Needs  Assistance Bed Mobility: Supine to Sit (supine to long sit)     Supine to sit: Modified independent (Device/Increase time);HOB elevated     General bed mobility comments: Pt able to complete long sit in bed, requiring min assist for bring his legs over EOB.    Transfers Overall transfer level: Needs assistance Equipment used: Rolling walker (2 wheeled) Transfers: Sit to/from Stand Sit to Stand: Min assist;From elevated surface         General transfer comment: Assist given for LE management when toileting and bed was elevated to assist with sit/stand from the bed.    Balance Overall balance assessment: Needs assistance Sitting-balance support: Feet supported Sitting balance-Leahy Scale: Fair Sitting balance - Comments: Pt has knees extended which effects his center of balance, however pt was able to maintain static sitting balance w/ no LOB.   Standing balance support: Bilateral upper extremity supported;During functional activity Standing balance-Leahy Scale: Poor Standing balance comment: heavily reliant on bil UE on RW.                           ADL either performed or assessed with clinical judgement   ADL Overall ADL's : Needs assistance/impaired     Grooming: Wash/dry hands;Standing;Min guard;Wash/dry face;Oral care;Sitting;Set up Grooming Details (indicate cue type and reason): Pt was able to complete handwashing at the sink, however, he was unable to tolerate standing to complete other grooming activities. Once seated in recliner, pt was able to complete oral care and face washing.     Lower Body Bathing: Moderate assistance;Cueing for safety;Cueing for compensatory techniques;Sit to/from stand Lower Body Bathing Details (indicate  cue type and reason): Pt able to complete pericare standing at toilet, with OT assisting for thoroughness.         Toilet Transfer: Moderate assistance;+2 for safety/equipment;Comfort height toilet;RW Toilet Transfer  Details (indicate cue type and reason): Pt was able to ambulate to toilet, pt needed assistance maintaining leg position when sitting on elevated toilet seat and lowering down to seat with control. Additionally pt required a trashcan to be placed under his legs to maintain comfortable extension while toileting. Toileting- Clothing Manipulation and Hygiene: Moderate assistance;Sit to/from stand Toileting - Clothing Manipulation Details (indicate cue type and reason): Pt required assist for thoroughness     Functional mobility during ADLs: Min guard;Rolling walker General ADL Comments: Pt demonstrated difficulty with lowering down to sit on both elevated toilet seat and elevated recliner seat. With mod assist pt is able to complete stand/sit. Pt demonstrated increased tolerance to ambulating in the room w/ RW.     Vision       Perception     Praxis      Cognition Arousal/Alertness: Awake/alert Behavior During Therapy: WFL for tasks assessed/performed Overall Cognitive Status: Within Functional Limits for tasks assessed                                 General Comments: Pt reported pain was down around 2 prior to movement. During Movement it shot up to a 6. Otherwise pt was pleasant and agreeable to PT/OT session        Exercises     Shoulder Instructions       General Comments CIR reported waiting on authorization, while OT/PT w/ pt.    Pertinent Vitals/ Pain       Pain Assessment: 0-10 Pain Score: 6  Faces Pain Scale: Hurts even more Pain Location: bil knees with moving legs around Pain Descriptors / Indicators: Grimacing;Guarding Pain Intervention(s): Monitored during session;Limited activity within patient's tolerance;Repositioned  Home Living                                          Prior Functioning/Environment              Frequency  Min 2X/week        Progress Toward Goals  OT Goals(current goals can now be found in the  care plan section)  Progress towards OT goals: Progressing toward goals  Acute Rehab OT Goals Patient Stated Goal: to go to rehab then home OT Goal Formulation: With patient Time For Goal Achievement: 05/28/20 Potential to Achieve Goals: Good ADL Goals Pt Will Perform Grooming: with modified independence;standing Pt Will Perform Lower Body Bathing: with mod assist;sit to/from stand;sitting/lateral leans;with adaptive equipment Pt Will Perform Lower Body Dressing: with mod assist;sitting/lateral leans;sit to/from stand;with adaptive equipment Pt Will Transfer to Toilet: with modified independence;anterior/posterior transfer;bedside commode Pt Will Perform Toileting - Clothing Manipulation and hygiene: with modified independence;sitting/lateral leans Additional ADL Goal #1: Pt will be Mod I in and OOB for transfers/basic ADLs  Plan Discharge plan remains appropriate;Frequency remains appropriate    Co-evaluation    PT/OT/SLP Co-Evaluation/Treatment: Yes Reason for Co-Treatment: For patient/therapist safety;To address functional/ADL transfers PT goals addressed during session: Mobility/safety with mobility;Balance;Proper use of DME;Strengthening/ROM OT goals addressed during session: ADL's and self-care;Strengthening/ROM      AM-PAC OT "6 Clicks" Daily Activity  Outcome Measure   Help from another person eating meals?: None Help from another person taking care of personal grooming?: A Little Help from another person toileting, which includes using toliet, bedpan, or urinal?: A Lot Help from another person bathing (including washing, rinsing, drying)?: A Lot Help from another person to put on and taking off regular upper body clothing?: A Little Help from another person to put on and taking off regular lower body clothing?: Total 6 Click Score: 15    End of Session Equipment Utilized During Treatment: Right knee immobilizer;Left knee immobilizer;Rolling walker;Gait belt  OT  Visit Diagnosis: Unsteadiness on feet (R26.81);Muscle weakness (generalized) (M62.81);Pain Pain - part of body: Knee;Leg (BLE)   Activity Tolerance Patient tolerated treatment well   Patient Left in chair;with call bell/phone within reach   Nurse Communication          Time: 5643-3295 OT Time Calculation (min): 27 min  Charges: OT General Charges $OT Visit: 1 Visit OT Treatments $Self Care/Home Management : 8-22 mins  Julio Zappia H., OTR/L Acute Rehabilitation  Justise Ehmann Elane Bing Plume 05/19/2020, 3:37 PM

## 2020-05-19 NOTE — Progress Notes (Signed)
Physical Therapy Treatment Patient Details Name: Arthur Ayers MRN: [id number] DOB: 1965/12/28 Today's Date: 05/19/2020    History of Present Illness 55 y.o. male admitted on 05/12/20 for fall from truck with bil patellar tendon injury s/p bil quad tendon repair on day of admission.  Pt WBAT post op, no knee flexion ROM, KI bil at all times.  Pt with significant PMH of R shoulder surgery (RTC repair 2015), low back bulging disc, HTN.    PT Comments    Pt is making progress towards goals.  Session today focused on OOB mobility and attempts at progressing gait distance.  He got up off of a lower elevated bed today, but had difficulty preforming peri care from seated or standing in bathroom (see OT notes for details).  We will continue to practice both gait and WC mobility as there may be situations when he is alone after d/c where the [facility] may be safer vs attempting to get up and walk.  He remains appropriate for CIR level therapies at discharge to get him more independent prior to d/c home.  PT will continue to follow acutely for safe mobility progression.  Plan to practice lateral transfers next session and [facility] education and mobility.   Follow Up Recommendations  CIR     Equipment Recommendations  Rolling walker with 5" wheels;Wheelchair (measurements PT);Wheelchair cushion (measurements PT);Hospital bed;3in1 (PT) (18x18 WC with bil elevating leg rests.)    Recommendations for Other Services       Precautions / Restrictions Precautions Precautions: Fall;Knee Precaution Comments: no knee flexion ROM Required Braces or Orthoses: Knee Immobilizer - Right;Knee Immobilizer - Left (bil bledsoe braces locked in extension) Knee Immobilizer - Right: On at all times Knee Immobilizer - Left: On at all times Restrictions RLE Weight Bearing: Weight bearing as tolerated LLE Weight Bearing: Weight bearing as tolerated    Mobility  Bed Mobility Overal bed mobility: Needs Assistance Bed Mobility:  Supine to Sit     Supine to sit: Min assist     General bed mobility comments: Min assist to help progress bil legs to EOB.  Pt moving more with less assist.  Able to come up to long sitting without aide at his trunk.  HOB elevated.    Transfers Overall transfer level: Needs assistance Equipment used: Rolling walker (2 wheeled) Transfers: Sit to/from Stand Sit to Stand: Min assist;+2 safety/equipment;From elevated surface         General transfer comment: Min two person assist to come to standing from elevated bed (lower than last time I saw him on Friday of last week) and maximally elevated BSC.  Ambulation/Gait Ambulation/Gait assistance: Min assist;+2 safety/equipment Gait Distance (Feet): 15 Feet (x2) Assistive device: Rolling walker (2 wheeled) Gait Pattern/deviations: Step-to pattern;Trunk flexed Gait velocity: decreased   General Gait Details: Asked pt if he felt he could walk further than to the bathroom and back (when we walk this is how far he has gone).  He reports he could not due to pain in bil legs.  This is is first time up on his new bledsoe braces which are locked in extension.   Stairs             Education officer, museum Details (indicate cue type and reason): WC left in room for him to practice lateral transfers tomorrow.  Modified Rankin (Stroke Patients Only)       Balance Overall balance assessment: Needs assistance Sitting-balance support: Feet supported;Bilateral upper extremity supported Sitting balance-Leahy  Scale: Fair     Standing balance support: Bilateral upper extremity supported Standing balance-Leahy Scale: Poor Standing balance comment: heavily reliant on bil UE on RW.                            Cognition Arousal/Alertness: Awake/alert Behavior During Therapy: WFL for tasks assessed/performed Overall Cognitive Status: Within Functional Limits for tasks assessed                                         Exercises      General Comments General comments (skin integrity, edema, etc.): Pt reported compliance with HEP.  I am hopeful to find him a leg loop (blue) for him to use to help him do his exercises when no one is here.  We spoke about doing "chair push ups" or "chair leans" for pressure relief while sitting up in the chair.  We discussed the continued benefit of going to CIR as they are set up to simulate things more like home than we are on the acute hospital side and he will be more confident, that much less painful and better able to care for himself if he goes through an intensive therapy program.      Pertinent Vitals/Pain Pain Assessment: Faces Faces Pain Scale: Hurts whole lot Pain Location: bil knees with moving legs around Pain Descriptors / Indicators: Grimacing;Guarding Pain Intervention(s): Limited activity within patient's tolerance;Monitored during session;Repositioned    Home Living                      Prior Function            PT Goals (current goals can now be found in the care plan section) Progress towards PT goals: Progressing toward goals    Frequency    Min 5X/week      PT Plan Current plan remains appropriate    Co-evaluation PT/OT/SLP Co-Evaluation/Treatment: Yes Reason for Co-Treatment: For patient/therapist safety PT goals addressed during session: Proper use of DME;Mobility/safety with mobility;Strengthening/ROM;Balance        AM-PAC PT "6 Clicks" Mobility   Outcome Measure  Help needed turning from your back to your side while in a flat bed without using bedrails?: A Little Help needed moving from lying on your back to sitting on the side of a flat bed without using bedrails?: A Little Help needed moving to and from a bed to a chair (including a wheelchair)?: A Little Help needed standing up from a chair using your arms (e.g., wheelchair or bedside chair)?: A Little Help needed to  walk in hospital room?: A Little Help needed climbing 3-5 steps with a railing? : A Lot 6 Click Score: 17    End of Session Equipment Utilized During Treatment: Gait belt;Right knee immobilizer;Left knee immobilizer;Other (comment) (bil bledsoe braces locked in ext) Activity Tolerance: Patient limited by pain Patient left: in chair;with call bell/phone within reach   PT Visit Diagnosis: Muscle weakness (generalized) (M62.81);Difficulty in walking, not elsewhere classified (R26.2);Pain Pain - Right/Left: Right (bil) Pain - part of body: Knee (bil knees)     Time: 7371-0626 PT Time Calculation (min) (ACUTE ONLY): 39 min  Charges:  $Gait Training: 8-22 mins                     Corinna Capra, PT, DPT  Acute Rehabilitation #[phone] pager #[phone] office

## 2020-05-20 NOTE — Progress Notes (Signed)
Subjective: Patient reports pain as mild to moderate. Slept ok. Tolerating diet. Urinating. No CP, SOB. Working with PT/OT on mobilizing OOB. Evals sound like he is doing better every time they work with him. Getting antsy from being in the room with no word about rehab yet.   Objective:   VITALS:   Vitals:   05/19/20 0841 05/19/20 1550 05/19/20 2018 05/20/20 0743  BP: 128/70 133/69 128/74 101/68  Pulse: 78 95 97 64  Resp: 17 16 16 15   Temp: 98.2 F (36.8 C) 98.9 F (37.2 C) 99.9 F (37.7 C) 98 F (36.7 C)  TempSrc: Oral Oral Oral Oral  SpO2: 99% 98% 100% 98%  Weight:      Height:       CBC Latest Ref Rng & Units 05/19/2020 05/18/2020 05/15/2020  WBC 4.0 - 10.5 K/uL 14.4(H) 13.8(H) 16.8(H)  Hemoglobin 13.0 - 17.0 g/dL 11.5(L) 11.2(L) 12.1(L)  Hematocrit 39.0 - 52.0 % 34.4(L) 33.3(L) 37.2(L)  Platelets 150 - 400 K/uL 275 299 249   BMP Latest Ref Rng & Units 05/19/2020 05/18/2020 05/16/2020  Glucose 70 - 99 mg/dL 07/16/2020) 317(W) 099(Y)  BUN 6 - 20 mg/dL 11 13 14   Creatinine 0.61 - 1.24 mg/dL 780(Q 4.47  Sodium 135 - 145 mmol/L 130(L) 130(L) 134(L)  Potassium 3.5 - 5.1 mmol/L 4.1 4.0 4.1  Chloride 98 - 111 mmol/L 99 100 103  CO2 22 - 32 mmol/L 23 22 26   Calcium 8.9 - 10.3 mg/dL 7.9(L) 8.1(L) 8.1(L)   Intake/Output      04/04 0701 04/05 0700 04/05 0701 04/06 0700   P.O. 740    I.V. (mL/kg) 1200 (12)    Total Intake(mL/kg) 1940 (19.4)    Urine (mL/kg/hr) 3400 (1.4)    Stool 0    Total Output 3400    Net -1460         Urine Occurrence 2 x    Stool Occurrence 1 x       Physical Exam: General: NAD. Sleeping in bed. Easily awoken though startled and needed to get his bearings Resp: No increased wob Cardio: regular rate and rhythm ABD soft Neurologically intact MSK Neurovascularly intact Sensation intact distally Intact pulses distally Dorsiflexion/Plantar flexion intact Incision: dressing C/D/I, B/L Bledsoe knee braces   Assessment: 7 Days Post-Op  S/P  Procedure(s) (LRB): REPAIR QUADRICEP TENDON (Bilateral) by Dr. 06/05. 06/05 on 05/13/20  Principal Problem:   Traumatic rupture of left quadriceps tendon Active Problems:   Acute pain of right knee   Esophageal reflux   Essential hypertension   Fatty liver disease, nonalcoholic   Gastro-esophageal reflux disease with esophagitis, without bleeding   Type II diabetes mellitus (HCC)   Traumatic rupture of right quadriceps tendon   Plan: More comfortable in the knee braces than he was in the immobilizers Anxious to move on to rehab  Up with therapy Incentive Spirometry Elevate and Apply ice  Weightbearing: WBAT B/L LE Insicional and dressing care: Dressings left intact until follow-up and Reinforce dressings as needed Orthopedic device(s): B/L Bledsoe knee braces locked in extension Showering: Ok to shower now but keep dressings dry VTE prophylaxis: Lovenox 40mg  qd while inpatient. Will switch to ASA 81mg  bid once moves on to rehab, SCDs, ambulation Pain control: Continue current regimen. Try to minimize narcotics Follow - up plan: 1 week in the office Contact information:  Jewel Baize MD, Eulah Pont PA-C  Dispo: TBD. Waiting on approval from Worker's Comp to go to CIR. Will continue  to work with PT/OT in the meantime.    Jenne Pane, PA-C Office [phone] 05/20/2020, 9:47 AM

## 2020-05-20 NOTE — Progress Notes (Signed)
Physical Therapy Treatment Patient Details Name: Arthur Ayers MRN: [id number] DOB: 23-Feb-1965 Today's Date: 05/20/2020    History of Present Illness 55 y.o. male admitted on 05/12/20 for fall from truck with bil patellar tendon injury s/p bil quad tendon repair on day of admission.  Pt WBAT post op, no knee flexion ROM, KI bil at all times.  Pt with significant PMH of R shoulder surgery (RTC repair 2015), low back bulging disc, HTN.    PT Comments    Pt feeling anxious and claustrophobic in room today, practiced AP transfers to wheeled recliner for pt to get out of room with his wife. Min A to LE's for posterior mvmt into chair. Pt performed supine SLR, AP's, glut sets and SL hip abduction in bed as well as core strengthening prior to transfer. Continue to recommend CIR before returning home. PT will continue to follow.   Follow Up Recommendations  CIR     Equipment Recommendations  Rolling walker with 5" wheels;Wheelchair (measurements PT);Wheelchair cushion (measurements PT);Hospital bed;3in1 (PT) (18x18 WC with bil elevating leg rests.)    Recommendations for Other Services Rehab consult     Precautions / Restrictions Precautions Precautions: Fall;Knee Precaution Booklet Issued: No Precaution Comments: no knee flexion ROM Required Braces or Orthoses: Knee Immobilizer - Right;Knee Immobilizer - Left (bil bledsoe braces locked in extension) Knee Immobilizer - Right: On at all times Knee Immobilizer - Left: On at all times Restrictions Weight Bearing Restrictions: Yes RLE Weight Bearing: Weight bearing as tolerated LLE Weight Bearing: Weight bearing as tolerated Other Position/Activity Restrictions: no knee flexion ROM    Mobility  Bed Mobility Overal bed mobility: Needs Assistance Bed Mobility: Supine to Sit;Sit to Supine;Rolling Rolling: Min assist   Supine to sit: Min assist Sit to supine: Modified independent (Device/Increase time);HOB elevated (long sitting to  supine)   General bed mobility comments: Min A with rolling to get hips stacked for abd exercise. Min assist to help progress bil legs to EOB.    Transfers Overall transfer level: Needs assistance   Transfers: Licensed conveyancer transfers: Min assist   General transfer comment: pt wanting to get out of room so AP transfer performed to recliner so wife could push him around unit and to windows. Min A to BLE's to [employer] during posterior scoot into recliner  Ambulation/Gait                 Stairs             Wheelchair Mobility    Modified Rankin (Stroke Patients Only)       Balance Overall balance assessment: Needs assistance Sitting-balance support: Feet supported;Bilateral upper extremity supported Sitting balance-Leahy Scale: Good Sitting balance - Comments: able to maintain long sitting as well as scoot hips side to side and bkwds without LOB                                    Cognition Arousal/Alertness: Awake/alert Behavior During Therapy: WFL for tasks assessed/performed Overall Cognitive Status: Within Functional Limits for tasks assessed                                        Exercises Total Joint Exercises Ankle Circles/Pumps: AROM;Both;15 reps;Supine Gluteal Sets: AROM;10 reps;Supine Hip ABduction/ADduction: AAROM;Both;10 reps;Sidelying Straight Leg  Raises: AAROM;Both;10 reps;Supine Other Exercises Other Exercises: sit ups, bed flat (supine to long sitting without use of arms) 5x    General Comments General comments (skin integrity, edema, etc.): wife and mother present for session and planning to take pt around unit once RN comes and disconnects him from IV pole      Pertinent Vitals/Pain Pain Assessment: 0-10 Pain Score: 4  Pain Location: bil knees with moving legs around Pain Descriptors / Indicators: Grimacing;Guarding Pain Intervention(s): Limited activity within  patient's tolerance;Monitored during session;Premedicated before session    Home Living                      Prior Function            PT Goals (current goals can now be found in the care plan section) Acute Rehab PT Goals Patient Stated Goal: to go to rehab then home Progress towards PT goals: Progressing toward goals    Frequency    Min 5X/week      PT Plan Current plan remains appropriate    Co-evaluation              AM-PAC PT "6 Clicks" Mobility   Outcome Measure  Help needed turning from your back to your side while in a flat bed without using bedrails?: A Little Help needed moving from lying on your back to sitting on the side of a flat bed without using bedrails?: A Little Help needed moving to and from a bed to a chair (including a wheelchair)?: A Little Help needed standing up from a chair using your arms (e.g., wheelchair or bedside chair)?: A Little Help needed to walk in hospital room?: A Little Help needed climbing 3-5 steps with a railing? : A Lot 6 Click Score: 17    End of Session Equipment Utilized During Treatment: Right knee immobilizer;Left knee immobilizer;Other (comment) (bil bledsoe braces locked in ext) Activity Tolerance: Patient tolerated treatment well Patient left: in chair;with call bell/phone within reach;with family/visitor present Nurse Communication: Mobility status PT Visit Diagnosis: Muscle weakness (generalized) (M62.81);Difficulty in walking, not elsewhere classified (R26.2);Pain Pain - Right/Left: Right (bil) Pain - part of body: Knee (bil knees)     Time: 8937-3428 PT Time Calculation (min) (ACUTE ONLY): 30 min  Charges:  $Therapeutic Exercise: 8-22 mins $Therapeutic Activity: 23-37 mins                     Lyanne Co, PT  Acute Rehab Services  Pager [phone] Office [phone]    Arthur Ayers 05/20/2020, 3:47 PM

## 2020-05-20 NOTE — TOC Progression Note (Signed)
Transition of Care [facility]) - Progression Note    Patient Details  Name: Arthur Ayers MRN: [id number] Date of Birth: 1965-04-19  Transition of Care [facility]) CM/SW Contact  Epifanio Lesches, RN Phone Number: 05/20/2020, 10:04 AM  Clinical Narrative:    [employer]. Case: Claim # [id number] NCM spoke with  [facility] / [name] ( covering adjuster) @  [phone]. Renae Fickle stated clinicals were received and passed on to pt's adjuster Marland [phone]).  Renae Fickle stated Phineas Semen to f/u with NCM with determination regarding CIR approval.  TOC team will continue to monitor and assist with TOC needs...  Expected Discharge Plan: IP Rehab Facility Barriers to Discharge: Other (comment)  Expected Discharge Plan and Services Expected Discharge Plan: IP Rehab Facility       Living arrangements for the past 2 months: Single Family Home                                       Social Determinants of Health (SDOH) Interventions    Readmission Risk Interventions No flowsheet data found.

## 2020-05-20 NOTE — PMR Pre-admission (Signed)
PMR Admission Coordinator Pre-Admission Assessment  Patient: Arthur Ayers is an 55 y.o., male MRN: [id number] DOB: Jan 26, 1966 Height: $RemoveBefo'5\' 9"'[id number]$  (175.3 cm) Weight: 99.8 kg  Insurance Information HMO:     PPO:      PCP:      IPA:      80/20:      OTHER:  PRIMARY: Murrell Redden [employer] Comp)      Policy#: [id number]      Subscriber: [id number] [place] Name: Jeanie Sewer      Phone#: [phone]  Fax#: [phone] Pre-Cert#: [id number]      Employer: [employer] Benefits:  Phone #:      Name:  Irene Shipper. Date:      Deduct:       Out of Pocket Max:       Life Max:  CIR:       SNF:  Outpatient:      Co-Pay:  Home Health:       Co-Pay:  DME:      Co-Pay:  Providers: Adjuster is Elesa Hacker at fax [phone] and phone [phone]  SECONDARY:       Policy#:      Phone#:   Development worker, community:       Phone#:   The Therapist, art Information Summary" for patients in Inpatient Rehabilitation Facilities with attached "Privacy Act [place] Records" was provided and verbally reviewed with: N/A  Emergency Contact Information Contact Information    Name Relation Home Work Mobile   Carvin,Sherri Spouse [phone]  [phone]      Current Medical History  Patient Admitting Diagnosis: Traumatic rupture of bilateral quadriceps tendon  History of Present Illness: A 55 y.o. male admitted on 05/12/20 after a fall from truck with bilateral patellar tendon injury s/p bil quad tendon repair on day of admission.  Pt WBAT post op, no knee flexion ROM, KI bil at all times.  Pt with significant PMH of R shoulder surgery (RTC repair 2015), low back bulging disc, HTN.  PT/OT evaluations were completed with recommendations for acute inpatient rehab admission.    Patient's medical record from [facility] has been reviewed by the rehabilitation admission coordinator and physician.  Past Medical History  Past Medical History:  Diagnosis Date  . Acute pain of right knee 05/13/2020  . GERD  (gastroesophageal reflux disease)   . Hypercholesteremia   . Hypertension     Family History   family history is not on file.  Prior Rehab/Hospitalizations Has the patient had prior rehab or hospitalizations prior to admission? No  Has the patient had major surgery during 100 days prior to admission? Yes   Current Medications  Current Facility-Administered Medications:  .  acetaminophen (TYLENOL) tablet 325-650 mg, 325-650 mg, Oral, Q6H PRN, Britt Bottom, PA-C, 650 mg at 05/17/20 2028 .  bisacodyl (DULCOLAX) suppository 10 mg, 10 mg, Rectal, Daily PRN, Gawne, Meghan M, PA-C .  diphenhydrAMINE (BENADRYL) 12.5 MG/5ML elixir 12.5-25 mg, 12.5-25 mg, Oral, Q4H PRN, Aggie Moats M, PA-C, 25 mg at 05/18/20 0931 .  docusate sodium (COLACE) capsule 100 mg, 100 mg, Oral, BID, Aggie Moats M, PA-C, 100 mg at 05/22/20 7322 .  enoxaparin (LOVENOX) injection 40 mg, 40 mg, Subcutaneous, Q24H, Gawne, Meghan M, PA-C, 40 mg at 05/21/20 1125 .  HYDROmorphone (DILAUDID) injection 0.5-1 mg, 0.5-1 mg, Intravenous, Q4H PRN, Aggie Moats M, PA-C, 1 mg at 05/14/20 1407 .  irbesartan (AVAPRO) tablet 150 mg, 150 mg, Oral, Daily, Aggie Moats M, PA-C, 150 mg at 05/22/20  0903 .  magnesium citrate solution 1 Bottle, 1 Bottle, Oral, Once PRN, Gawne, Meghan M, PA-C .  methocarbamol (ROBAXIN) tablet 500 mg, 500 mg, Oral, Q6H PRN, 500 mg at 05/21/20 2043 **OR** methocarbamol (ROBAXIN) 500 mg in dextrose 5 % 50 mL IVPB, 500 mg, Intravenous, Q6H PRN, Gawne, Meghan M, PA-C .  metoCLOPramide (REGLAN) tablet 5-10 mg, 5-10 mg, Oral, Q8H PRN **OR** metoCLOPramide (REGLAN) injection 5-10 mg, 5-10 mg, Intravenous, Q8H PRN, Gawne, Meghan M, PA-C .  multivitamin with minerals tablet 1 tablet, 1 tablet, Oral, Daily, Aggie Moats M, PA-C, 1 tablet at 05/22/20 0904 .  ondansetron (ZOFRAN) tablet 4 mg, 4 mg, Oral, Q6H PRN **OR** ondansetron (ZOFRAN) injection 4 mg, 4 mg, Intravenous, Q6H PRN, Gawne, Meghan M, PA-C .  oxyCODONE  (Oxy IR/ROXICODONE) immediate release tablet 10-15 mg, 10-15 mg, Oral, Q4H PRN, Aggie Moats M, PA-C, 15 mg at 05/20/20 0428 .  oxyCODONE (Oxy IR/ROXICODONE) immediate release tablet 5-10 mg, 5-10 mg, Oral, Q4H PRN, Aggie Moats M, PA-C, 10 mg at 05/18/20 2158 .  pantoprazole (PROTONIX) EC tablet 40 mg, 40 mg, Oral, Daily, Aggie Moats M, PA-C, 40 mg at 05/22/20 0904 .  polyethylene glycol (MIRALAX / GLYCOLAX) packet 17 g, 17 g, Oral, Daily PRN, Aggie Moats M, PA-C, 17 g at 05/16/20 0845 .  simvastatin (ZOCOR) tablet 20 mg, 20 mg, Oral, q1800, Aggie Moats M, PA-C, 20 mg at 05/21/20 1703 .  traMADol (ULTRAM) tablet 50 mg, 50 mg, Oral, Q6H, Gawne, Meghan M, PA-C, 50 mg at 05/22/20 2706  Patients Current Diet:  Diet Order            Diet regular Room service appropriate? Yes; Fluid consistency: Thin  Diet effective now                 Precautions / Restrictions Precautions Precautions: Fall,Knee Precaution Booklet Issued: No Precaution Comments: no knee flexion ROM Restrictions Weight Bearing Restrictions: Yes RLE Weight Bearing: Weight bearing as tolerated LLE Weight Bearing: Weight bearing as tolerated Other Position/Activity Restrictions: no knee flexion ROM   Has the patient had 2 or more falls or a fall with injury in the past year? Yes  Prior Activity Level Community (5-7x/wk): working, driving  Prior Functional Level Self Care: Did the patient need help bathing, dressing, using the toilet or eating? Independent  Indoor Mobility: Did the patient need assistance with walking from room to room (with or without device)? Independent  Stairs: Did the patient need assistance with internal or external stairs (with or without device)? Independent  Functional Cognition: Did the patient need help planning regular tasks such as shopping or remembering to take medications? Independent  Home Assistive Devices / Equipment Home Assistive Devices/Equipment: None Home Equipment:  None  Prior Device Use: Indicate devices/aids used by the patient prior to current illness, exacerbation or injury? None of the above  Current Functional Level Cognition  Overall Cognitive Status: Within Functional Limits for tasks assessed Orientation Level: Oriented X4 General Comments: Pt reported pain was at a 1 prior to functional mobility. With mobility, pt reported max pain was 5.    Extremity Assessment (includes Sensation/Coordination)  Upper Extremity Assessment: Overall WFL for tasks assessed RUE Deficits / Details: WNL AROM, pt reports some residual deficits in R shoulder mainly limited in amount of resistance in overhead position ("I can only lift up 25#")  Lower Extremity Assessment: Defer to PT evaluation    ADLs  Overall ADL's : Needs assistance/impaired Eating/Feeding: Sitting,Independent Eating/Feeding Details (indicate cue  type and reason): Pt able to bring cup of water to mouth and drink. Grooming: Wash/dry hands,Standing,Minimal assistance,Wash/dry face,Brushing hair,Sitting,Set up Grooming Details (indicate cue type and reason): Pt completed handwashing at the sink with min A for safety, All other grooming was completed sitting in recliner with setup. Upper Body Bathing: Sitting,Minimal assistance Upper Body Bathing Details (indicate cue type and reason): Pt able to wash upper body with set up, OT assisted with reaching back. Lower Body Bathing: Moderate assistance,Cueing for safety,Cueing for compensatory techniques,Sit to/from stand Lower Body Bathing Details (indicate cue type and reason): Pt able to complete pericare standing at toilet, with OT assisting for thoroughness. Upper Body Dressing : Set up,Sitting Upper Body Dressing Details (indicate cue type and reason): Pt doffed and donned hospital gown seated in recliner. Lower Body Dressing: Maximal assistance Lower Body Dressing Details (indicate cue type and reason): Pt unable to reach feet to put on  socks Toilet Transfer: Moderate assistance,BSC,RW Toilet Transfer Details (indicate cue type and reason): Pt required mod assist to sit and stand from [facility] over toilet. OT assisted postioning by providing trashcan under feet to keep knees in extension while toileting. Toileting- Clothing Manipulation and Hygiene: Moderate assistance,Sit to/from stand Toileting - Clothing Manipulation Details (indicate cue type and reason): Pt required assist for thoroughness Functional mobility during ADLs: Min guard,Rolling walker General ADL Comments: Pt demonstrated difficulty with lowering down to sit on both elevated toilet seat and elevated recliner seat. With mod assist pt is able to complete stand/sit. Pt demonstrated increased tolerance to ambulating in the room w/ RW.    Mobility  Overal bed mobility: Modified Independent Bed Mobility: Supine to Sit Rolling: Min assist Supine to sit: Modified independent (Device/Increase time),HOB elevated Sit to supine: Modified independent (Device/Increase time),HOB elevated (long sitting to supine) General bed mobility comments: Pt able to come to EOB with Mod I using bed railings and HOB elevated.    Transfers  Overall transfer level: Needs assistance Equipment used: Rolling walker (2 wheeled) Transfers: Sit to/from Stand Sit to Stand: From elevated surface,Min assist Anterior-Posterior transfers: Min assist  Lateral/Scoot Transfers: Min assist,With slide board,From elevated surface General transfer comment: pt demonstrates increased trunk flexion to facilitate standing and maintain knee extension    Ambulation / Gait / Stairs / Wheelchair Mobility  Ambulation/Gait Ambulation/Gait assistance: Min guard,+2 safety/equipment Gait Distance (Feet): 80 Feet Assistive device: Rolling walker (2 wheeled) Gait Pattern/deviations: Step-to pattern,Trunk flexed,Shuffle General Gait Details: Asked pt if he felt he could walk further than to the bathroom and back (when  we walk this is how far he has gone).  He reports he could not due to pain in bil legs.  This is is first time up on his new bledsoe braces which are locked in extension. Gait velocity: decreased Gait velocity interpretation: <1.31 ft/sec, indicative of household ambulator Stairs: Yes Stairs assistance: Min assist,+2 physical assistance Stair Management: No rails,Backwards,With walker Number of Stairs: 2 General stair comments: Practiced small 4" steps (low side of practice stairs in ortho gym) with pt and his wife.  PT first visually demonstrated, provided handout and then pt, therapist and wife practiced goin gup and down two small steps with RW backwards.  Heavy min assist with wife stabilizing RW and therapist supporting pt and assisting in stabilizing the RW from behind.  Pt had significant pain and difficulty attempting these small stairs, so I am not sure how getting up/down larger stairs will go.  He may need to rent a ramp for home access  until he is allowed to bend his knees Product manager mobility: Yes Wheelchair propulsion: Both upper extremities Wheelchair parts: Needs assistance Distance: 300 Wheelchair Assistance Details (indicate cue type and reason): WC left in room for him to practice lateral transfers tomorrow.    Posture / Balance Dynamic Sitting Balance Sitting balance - Comments: Pt able to maintain long sit as well as sitting EOB w/ feet out in front supported on the floor. Balance Overall balance assessment: Needs assistance Sitting-balance support: Feet supported Sitting balance-Leahy Scale: Good Sitting balance - Comments: Pt able to maintain long sit as well as sitting EOB w/ feet out in front supported on the floor. Standing balance support: Single extremity supported Standing balance-Leahy Scale: Poor Standing balance comment: relies on bil UE on RW, momentarily was able to use single UE to don face mask    Special needs/care consideration  Continuous Drip IV  0.9% sodium chloride infusion: 100 mL/hr, Skin Surgical incision: leg/right, left and Designated visitor Posey Petrik, wife   Previous Home Environment (from acute therapy documentation) Living Arrangements: Spouse/significant other,Children  Lives With: Spouse,Daughter Available Help at Discharge: Family,Available 24 hours/day Type of Home: House Home Layout: Multi-level,1/2 bath on main level Alternate Level Stairs-Rails: Left Alternate Level Stairs-Number of Steps: flight Home Access: Stairs to enter Entrance Stairs-Rails: None Entrance Stairs-Number of Steps: 2 in garage, 3 in front [employer] Shower/Tub: Multimedia programmer: Standard Bathroom Accessibility: Yes How Accessible: Accessible via walker Home Care Services: No Additional Comments: works for [employer]  Discharge Living Setting Plans for Discharge Living Setting: Patient's home Type of Home at Discharge: House Discharge Home Layout: Multi-level,1/2 bath on main level Alternate Level Stairs-Rails: Left Alternate Level Stairs-Number of Steps: flight Discharge Home Access: Stairs to enter Entrance Stairs-Rails: None Entrance Stairs-Number of Steps: 2 in garage, 3 in front Discharge Bathroom Shower/Tub: Walk-in shower Discharge Bathroom Toilet: Standard Discharge Bathroom Accessibility: Yes How Accessible: Accessible via walker Does the patient have any problems obtaining your medications?: No  Social/Family/Support Systems Anticipated Caregiver: Franko Hilliker, wife Anticipated Caregiver's Contact Information: [phone] Caregiver Availability: 24/7 Does Caregiver/Family have Issues with Lodging/Transportation while Pt is in Rehab?: No  Goals Patient/Family Goal for Rehab: PT/OT mod I and supervision goals Expected length of stay: 7-10 days Pt/Family Agrees to Admission and willing to participate: Yes Program Orientation Provided & Reviewed with Pt/Caregiver Including Roles  &  Responsibilities: Yes  Decrease burden of Care through IP rehab admission: NA  Possible need for SNF placement upon discharge: NA  Patient Condition: I have reviewed medical records from [facility], spoken with CM, and patient. I met with patient at the bedside for inpatient rehabilitation assessment.  Patient will benefit from ongoing PT and OT, can actively participate in 3 hours of therapy a day 5 days of the week, and can make measurable gains during the admission.  Patient will also benefit from the coordinated team approach during an Inpatient Acute Rehabilitation admission.  The patient will receive intensive therapy as well as Rehabilitation physician, nursing, social worker, and care management interventions.  Due to bladder management, bowel management, safety, skin/wound care, disease management, medication administration, pain management and patient education the patient requires 24 hour a day rehabilitation nursing.  The patient is currently min to mod assist with mobility and basic ADLs.  Discharge setting and therapy post discharge at home with home health is anticipated.  Patient has agreed to participate in the Acute Inpatient Rehabilitation Program and will admit today.  Preadmission Screen Completed By:  Evalee Mutton Logue, 05/22/2020 11:50 AM ______________________________________________________________________   Discussed status with Dr. Dagoberto Ligas and Dr. Naaman Plummer on 05/22/20 at 1000 and received approval for admission today.  Admission Coordinator:  Retta Diones, RN, time 1148/Date 05/22/20   Assessment/Plan: Diagnosis: 1. Does the need for close, 24 hr/day Medical supervision in concert with the patient's rehab needs make it unreasonable for this patient to be served in a less intensive setting? Yes 2. Co-Morbidities requiring supervision/potential complications: HTN, post op pain, /L quad tendon repair- WBAT with no flexion/Bledsoes B/L, LBP, RTC repair 2015- still painful 3. Due to  bladder management, bowel management, safety, skin/wound care, disease management, medication administration, pain management and patient education, does the patient require 24 hr/day rehab nursing? Yes 4. Does the patient require coordinated care of a physician, rehab nurse, PT, OT, and SLP to address physical and functional deficits in the context of the above medical diagnosis(es)? Yes Addressing deficits in the following areas: balance, endurance, locomotion, strength, transferring, bathing, dressing, feeding, grooming and toileting 5. Can the patient actively participate in an intensive therapy program of at least 3 hrs of therapy 5 days a week? Yes 6. The potential for patient to make measurable gains while on inpatient rehab is good 7. Anticipated functional outcomes upon discharge from inpatient rehab: modified independent and supervision PT, modified independent and supervision OT, n/a SLP 8. Estimated rehab length of stay to reach the above functional goals is: 7-10 days 9. Anticipated discharge destination: Home 10. Overall Rehab/Functional Prognosis: good   MD Signature:

## 2020-05-21 NOTE — Progress Notes (Signed)
    Subjective: Patient reports pain as mild. Muscle spasms at night when falling asleep. Tolerating diet. Urinating. No CP, SOB. Working with PT/OT on mobilizing OOB. Evals sound like he is doing better every time they work with him.   Objective:   VITALS:   Vitals:   05/20/20 1446 05/20/20 2057 05/21/20 0318 05/21/20 0748  BP: 110/61 120/66 (!) 108/57 105/62  Pulse: 79 80 74 63  Resp: 18 16 16 18   Temp: 98.1 F (36.7 C) 98.5 F (36.9 C) 98.2 F (36.8 C) 97.9 F (36.6 C)  TempSrc: Oral Oral Oral   SpO2: 98% 96% 95% 98%  Weight:      Height:       CBC Latest Ref Rng & Units 05/19/2020 05/18/2020 05/15/2020  WBC 4.0 - 10.5 K/uL 14.4(H) 13.8(H) 16.8(H)  Hemoglobin 13.0 - 17.0 g/dL 11.5(L) 11.2(L) 12.1(L)  Hematocrit 39.0 - 52.0 % 34.4(L) 33.3(L) 37.2(L)  Platelets 150 - 400 K/uL 275 299 249   BMP Latest Ref Rng & Units 05/19/2020 05/18/2020 05/16/2020  Glucose 70 - 99 mg/dL 07/16/2020) 683(M) 196(Q)  BUN 6 - 20 mg/dL 11 13 14   Creatinine 0.61 - 1.24 mg/dL 229(N 9.89  Sodium 135 - 145 mmol/L 130(L) 130(L) 134(L)  Potassium 3.5 - 5.1 mmol/L 4.1 4.0 4.1  Chloride 98 - 111 mmol/L 99 100 103  CO2 22 - 32 mmol/L 23 22 26   Calcium 8.9 - 10.3 mg/dL 7.9(L) 8.1(L) 8.1(L)   Intake/Output      04/05 0701 04/06 0700 04/06 0701 04/07 0700   P.O. 1080    I.V. (mL/kg)     Total Intake(mL/kg) 1080 (10.8)    Urine (mL/kg/hr) 3500 (1.5) 600 (1.3)   Stool 0    Total Output 3500 600   Net -2420 -600        Stool Occurrence 1 x       Physical Exam: General: NAD. Awake. Sitting in chair watching tv Resp: No increased wob Cardio: regular rate and rhythm ABD soft Neurologically intact MSK Neurovascularly intact Sensation intact distally Intact pulses distally Dorsiflexion/Plantar flexion intact Incision: dressing C/D/I, B/L Bledsoe knee braces   Assessment: 8 Days Post-Op  S/P Procedure(s) (LRB): REPAIR QUADRICEP TENDON (Bilateral) by Dr. 06/06. 06/06 on 05/13/20  Principal  Problem:   Traumatic rupture of left quadriceps tendon Active Problems:   Acute pain of right knee   Esophageal reflux   Essential hypertension   Fatty liver disease, nonalcoholic   Gastro-esophageal reflux disease with esophagitis, without bleeding   Type II diabetes mellitus (HCC)   Traumatic rupture of right quadriceps tendon   Plan: Wants to move on to rehab  Up with therapy Incentive Spirometry Elevate and Apply ice  Weightbearing: WBAT B/L LE Insicional and dressing care: Dressings left intact until follow-up and Reinforce dressings as needed. If still inpatient at 2 week post-op mark then will remove dressings to check wound Orthopedic device(s): B/L Bledsoe knee braces locked in extension Showering: Ok to shower now but keep dressings dry VTE prophylaxis: Lovenox 40mg  qd while inpatient. Will switch to ASA 81mg  bid once moves on to rehab, SCDs, ambulation Pain control: Continue current regimen. Try to minimize narcotics Follow - up plan: 1 week in the office Contact information:  Jewel Baize MD, Eulah Pont PA-C  Dispo: TBD. Waiting on approval from Worker's Comp to go to CIR. Will continue to work with PT/OT in the meantime.    05/15/20, PA-C Office [phone] 05/21/2020, 11:47 AM

## 2020-05-21 NOTE — Progress Notes (Signed)
Occupational Therapy Treatment Patient Details Name: Arthur Ayers MRN: [id number] DOB: [birth year] Today's Date: 05/21/2020    History of present illness 55 y.o. male admitted on 05/12/20 for fall from truck with bil patellar tendon injury s/p bil quad tendon repair on day of admission.  Pt WBAT post op, no knee flexion ROM, KI bil at all times.  Pt with significant PMH of R shoulder surgery (RTC repair 2015), low back bulging disc, HTN.   OT comments  Pt progressing consistently towards his goals. Pt able to complete functional mobility and transfers using RW and elevated surfaces w/ min guard for mobility and mod A for transfers. Pt will continue to progress towards goals and will benefit from CIR to continue improving towards independence. OT will continue to follow acutely.    Follow Up Recommendations  CIR;Supervision/Assistance - 24 hour    Equipment Recommendations  3 in 1 bedside commode;Wheelchair (measurements OT);Wheelchair cushion (measurements OT);Tub/shower bench    Recommendations for Other Services      Precautions / Restrictions Precautions Precautions: Fall;Knee Precaution Booklet Issued: No Precaution Comments: no knee flexion ROM Required Braces or Orthoses: Knee Immobilizer - Right;Knee Immobilizer - Left Knee Immobilizer - Right: On at all times Knee Immobilizer - Left: On at all times Restrictions Weight Bearing Restrictions: Yes RLE Weight Bearing: Weight bearing as tolerated LLE Weight Bearing: Weight bearing as tolerated Other Position/Activity Restrictions: no knee flexion ROM       Mobility Bed Mobility Overal bed mobility: Modified Independent Bed Mobility: Supine to Sit     Supine to sit: Modified independent (Device/Increase time);HOB elevated     General bed mobility comments: Pt able to come to EOB with Mod I using bed railings and HOB elevated.    Transfers Overall transfer level: Needs assistance Equipment used: Rolling walker (2  wheeled) Transfers: Sit to/from Stand Sit to Stand: Mod assist;From elevated surface         General transfer comment: Pt increased strength and tolerance to standing from elevated surfaces w/ +1 mod A    Balance Overall balance assessment: Needs assistance Sitting-balance support: Feet supported Sitting balance-Leahy Scale: Good Sitting balance - Comments: Pt able to maintain long sit as well as sitting EOB w/ feet out in front supported on the floor.   Standing balance support: Bilateral upper extremity supported;During functional activity Standing balance-Leahy Scale: Poor Standing balance comment: heavily reliant on bil UE on RW.                           ADL either performed or assessed with clinical judgement   ADL Overall ADL's : Needs assistance/impaired Eating/Feeding: Sitting;Independent Eating/Feeding Details (indicate cue type and reason): Pt able to bring cup of water to mouth and drink. Grooming: Wash/dry hands;Standing;Minimal assistance;Wash/dry face;Brushing hair;Sitting;Set up Grooming Details (indicate cue type and reason): Pt completed handwashing at the sink with min A for safety, All other grooming was completed sitting in recliner with setup. Upper Body Bathing: Sitting;Minimal assistance Upper Body Bathing Details (indicate cue type and reason): Pt able to wash upper body with set up, OT assisted with reaching back. Lower Body Bathing: Moderate assistance;Cueing for safety;Cueing for compensatory techniques;Sit to/from stand Lower Body Bathing Details (indicate cue type and reason): Pt able to complete pericare standing at toilet, with OT assisting for thoroughness. Upper Body Dressing : Set up;Sitting Upper Body Dressing Details (indicate cue type and reason): Pt doffed and donned hospital gown seated in recliner. Lower  Body Dressing: Maximal assistance Lower Body Dressing Details (indicate cue type and reason): Pt unable to reach feet to put on  socks Toilet Transfer: Moderate assistance;BSC;RW Toilet Transfer Details (indicate cue type and reason): Pt required mod assist to sit and stand from [facility] over toilet. OT assisted postioning by providing trashcan under feet to keep knees in extension while toileting. Toileting- Clothing Manipulation and Hygiene: Moderate assistance;Sit to/from stand Toileting - Clothing Manipulation Details (indicate cue type and reason): Pt required assist for thoroughness     Functional mobility during ADLs: Min guard;Rolling walker General ADL Comments: Pt demonstrated difficulty with lowering down to sit on both elevated toilet seat and elevated recliner seat. With mod assist pt is able to complete stand/sit. Pt demonstrated increased tolerance to ambulating in the room w/ RW.     Vision       Perception     Praxis      Cognition Arousal/Alertness: Awake/alert Behavior During Therapy: WFL for tasks assessed/performed Overall Cognitive Status: Within Functional Limits for tasks assessed                                 General Comments: Pt reported pain was at a 1 prior to functional mobility. With mobility, pt reported max pain was 5.        Exercises     Shoulder Instructions       General Comments VSS    Pertinent Vitals/ Pain       Pain Assessment: 0-10 Pain Score: 5  Pain Location: bil knees with moving legs around Pain Descriptors / Indicators: Discomfort;Aching;Sore Pain Intervention(s): Monitored during session;Repositioned  Home Living                                          Prior Functioning/Environment              Frequency  Min 2X/week        Progress Toward Goals  OT Goals(current goals can now be found in the care plan section)  Progress towards OT goals: Progressing toward goals  Acute Rehab OT Goals Patient Stated Goal: to go to rehab then home OT Goal Formulation: With patient Time For Goal Achievement:  05/28/20 Potential to Achieve Goals: Good ADL Goals Pt Will Perform Grooming: with modified independence;standing Pt Will Perform Lower Body Bathing: with mod assist;sit to/from stand;sitting/lateral leans;with adaptive equipment Pt Will Perform Lower Body Dressing: with mod assist;sitting/lateral leans;sit to/from stand;with adaptive equipment Pt Will Transfer to Toilet: with modified independence;ambulating;bedside commode Pt Will Perform Toileting - Clothing Manipulation and hygiene: with modified independence;sitting/lateral leans;sit to/from stand Additional ADL Goal #1: Pt will be Mod I in and OOB for transfers/basic ADLs  Plan Discharge plan remains appropriate;Frequency remains appropriate    Co-evaluation                 AM-PAC OT "6 Clicks" Daily Activity     Outcome Measure   Help from another person eating meals?: None Help from another person taking care of personal grooming?: A Little Help from another person toileting, which includes using toliet, bedpan, or urinal?: A Lot Help from another person bathing (including washing, rinsing, drying)?: A Lot Help from another person to put on and taking off regular upper body clothing?: A Little Help from another person to put on and  taking off regular lower body clothing?: Total 6 Click Score: 15    End of Session Equipment Utilized During Treatment: Gait belt;Rolling walker;Right knee immobilizer;Left knee immobilizer  OT Visit Diagnosis: Unsteadiness on feet (R26.81);Other abnormalities of gait and mobility (R26.89);Muscle weakness (generalized) (M62.81)   Activity Tolerance Patient tolerated treatment well   Patient Left in chair;with call bell/phone within reach   Nurse Communication Mobility status        Time: 3532-9924 OT Time Calculation (min): 29 min  Charges: OT General Charges $OT Visit: 1 Visit OT Treatments $Self Care/Home Management : 23-37 mins  Makesha Belitz H., OTR/L Acute Rehabilitation  Kayse Puccini  Elane Kannan Proia 05/21/2020, 10:55 AM

## 2020-05-21 NOTE — TOC Progression Note (Addendum)
Transition of Care [facility]) - Progression Note    Patient Details  Name: Arthur Ayers MRN: [id number] Date of Birth: 29-Sep-1965  Transition of Care [facility]) CM/SW Contact  Epifanio Lesches, RN Phone Number: 05/21/2020, 4:51 PM  Clinical Narrative:   [employer]. Case:Claim # [id number]  NCM called [employer]/ Workman's Comp adjuster Ashton [phone]). NCM unable to speak with [place] 2/2 on phone. NCM spoke with a Marcelino Duster Radiographer, therapeutic) and requested  status of IR approval. Marcelino Duster stated she would have Phineas Semen to call NCM or CIR admission liaison concerning matter.  TOC team will continue to monitor and assist with needs    Expected Discharge Plan: IP Rehab Facility Barriers to Discharge: Other (comment) (AWAITING INPATIENT REHAB APPROVAL FROM WORKMEN'[employer]./ [employer])  Expected Discharge Plan and Services Expected Discharge Plan: IP Rehab Facility       Living arrangements for the past 2 months: Single Family Home                                       Social Determinants of Health (SDOH) Interventions    Readmission Risk Interventions No flowsheet data found.

## 2020-05-21 NOTE — Plan of Care (Signed)
  Problem: Activity: Goal: Risk for activity intolerance will decrease Outcome: Progressing   Problem: Pain Managment: Goal: General experience of comfort will improve Outcome: Progressing   Problem: Safety: Goal: Ability to remain free from injury will improve Outcome: Progressing   

## 2020-05-21 NOTE — Progress Notes (Signed)
Physical Therapy Treatment Patient Details Name: Arthur Ayers MRN: [id number] DOB: 08-06-1965 Today's Date: 05/21/2020    History of Present Illness 55 y.o. male admitted on 05/12/20 for fall from truck with bil patellar tendon injury s/p bil quad tendon repair on day of admission.  Pt WBAT post op, no knee flexion ROM, KI bil at all times.  Pt with significant PMH of R shoulder surgery (RTC repair 2015), low back bulging disc, HTN.    PT Comments    Pt demonstrates improved activity tolerance from previous sessions, ambulating a greater distance and decreased level of assistance. Pt was able to ambulate a distance of 80 feet limited by onset of pain and fatigue, requiring a seated resting break. Pt will benefit from CIR to improve independence functional mobility, improve safety with gait, address remaining deficits in bil LE strength to return to prior level of function.    Follow Up Recommendations  CIR     Equipment Recommendations  Rolling walker with 5" wheels;Wheelchair (measurements PT);Wheelchair cushion (measurements PT);Hospital bed;3in1 (PT)    Recommendations for Other Services Rehab consult     Precautions / Restrictions Precautions Precautions: Fall;Knee Precaution Booklet Issued: No Precaution Comments: no knee flexion ROM Required Braces or Orthoses: Knee Immobilizer - Right;Knee Immobilizer - Left Knee Immobilizer - Right: On at all times Knee Immobilizer - Left: On at all times Restrictions Weight Bearing Restrictions: Yes RLE Weight Bearing: Weight bearing as tolerated LLE Weight Bearing: Weight bearing as tolerated Other Position/Activity Restrictions: no knee flexion ROM    Mobility  Bed Mobility                    Transfers Overall transfer level: Needs assistance Equipment used: Rolling walker (2 wheeled) Transfers: Sit to/from Stand Sit to Stand: From elevated surface;Min assist         General transfer comment: pt demonstrates  increased trunk flexion to facilitate standing and maintain knee extension  Ambulation/Gait Ambulation/Gait assistance: Min guard;+2 safety/equipment Gait Distance (Feet): 80 Feet Assistive device: Rolling walker (2 wheeled) Gait Pattern/deviations: Step-to pattern;Trunk flexed;Shuffle Gait velocity: decreased Gait velocity interpretation: <1.31 ft/sec, indicative of household Astronomer mobility: Yes Wheelchair propulsion: Both upper extremities Wheelchair parts: Needs assistance Distance: 300  Modified Rankin (Stroke Patients Only)       Balance Overall balance assessment: Needs assistance Sitting-balance support: Feet supported Sitting balance-Leahy Scale: Good     Standing balance support: Single extremity supported Standing balance-Leahy Scale: Poor Standing balance comment: relies on bil UE on RW, momentarily was able to use single UE to don face mask                            Cognition Arousal/Alertness: Awake/alert Behavior During Therapy: WFL for tasks assessed/performed Overall Cognitive Status: Within Functional Limits for tasks assessed                                        Exercises      General Comments General comments (skin integrity, edema, etc.): VSS      Pertinent Vitals/Pain Pain Assessment: 0-10 Pain Score: 4  Pain Location: bil knees with gait Pain Descriptors / Indicators: Discomfort;Aching;Sore Pain Intervention(s): Monitored during session    Home Living  Prior Function            PT Goals (current goals can now be found in the care plan section) Acute Rehab PT Goals Patient Stated Goal: to go to rehab then home Progress towards PT goals: Progressing toward goals    Frequency    Min 5X/week      PT Plan Current plan remains appropriate    Co-evaluation              AM-PAC  PT "6 Clicks" Mobility   Outcome Measure  Help needed turning from your back to your side while in a flat bed without using bedrails?: A Little Help needed moving from lying on your back to sitting on the side of a flat bed without using bedrails?: A Little Help needed moving to and from a bed to a chair (including a wheelchair)?: A Little Help needed standing up from a chair using your arms (e.g., wheelchair or bedside chair)?: A Little Help needed to walk in hospital room?: A Little Help needed climbing 3-5 steps with a railing? : A Lot 6 Click Score: 17    End of Session Equipment Utilized During Treatment: Right knee immobilizer;Left knee immobilizer Activity Tolerance: Patient tolerated treatment well Patient left: in chair;with family/visitor present;with call bell/phone within reach Nurse Communication: Mobility status PT Visit Diagnosis: Muscle weakness (generalized) (M62.81);Difficulty in walking, not elsewhere classified (R26.2);Pain;Other abnormalities of gait and mobility (R26.89) Pain - Right/Left: Right (bilateral) Pain - part of body: Knee     Time: 2595-6387 PT Time Calculation (min) (ACUTE ONLY): 24 min  Charges:  $Gait Training: 8-22 mins $Wheel Chair Management: 8-22 mins                     Acute Rehab Pager: [phone]   Waldemar Dickens, SPT 05/21/2020, 5:11 PM

## 2020-05-22 ENCOUNTER — Encounter (HOSPITAL_COMMUNITY): Payer: Self-pay | Admitting: Orthopedic Surgery

## 2020-05-22 ENCOUNTER — Encounter (HOSPITAL_COMMUNITY): Payer: Self-pay | Admitting: Physical Medicine & Rehabilitation

## 2020-05-22 ENCOUNTER — Inpatient Hospital Stay (HOSPITAL_COMMUNITY)
Admission: RE | Admit: 2020-05-22 | Discharge: 2020-06-06 | DRG: 560 | Disposition: A | Discharge status: Home / Self Care | Payer: No Typology Code available for payment source | Source: Intra-hospital | Attending: Physical Medicine & Rehabilitation | Admitting: Physical Medicine & Rehabilitation

## 2020-05-22 ENCOUNTER — Other Ambulatory Visit: Payer: Self-pay

## 2020-05-22 DIAGNOSIS — S82002D Unspecified fracture of left patella, subsequent encounter for closed fracture with routine healing: Principal | ICD-10-CM

## 2020-05-22 DIAGNOSIS — W19XXXD Unspecified fall, subsequent encounter: Secondary | ICD-10-CM | POA: Diagnosis present

## 2020-05-22 DIAGNOSIS — E119 Type 2 diabetes mellitus without complications: Secondary | ICD-10-CM | POA: Diagnosis present

## 2020-05-22 DIAGNOSIS — N182 Chronic kidney disease, stage 2 (mild): Secondary | ICD-10-CM | POA: Diagnosis present

## 2020-05-22 DIAGNOSIS — E8809 Other disorders of plasma-protein metabolism, not elsewhere classified: Secondary | ICD-10-CM | POA: Diagnosis present

## 2020-05-22 DIAGNOSIS — S76112D Strain of left quadriceps muscle, fascia and tendon, subsequent encounter: Secondary | ICD-10-CM | POA: Diagnosis not present

## 2020-05-22 DIAGNOSIS — E1122 Type 2 diabetes mellitus with diabetic chronic kidney disease: Secondary | ICD-10-CM | POA: Diagnosis present

## 2020-05-22 DIAGNOSIS — S82002S Unspecified fracture of left patella, sequela: Secondary | ICD-10-CM | POA: Diagnosis not present

## 2020-05-22 DIAGNOSIS — E871 Hypo-osmolality and hyponatremia: Secondary | ICD-10-CM | POA: Diagnosis present

## 2020-05-22 DIAGNOSIS — K219 Gastro-esophageal reflux disease without esophagitis: Secondary | ICD-10-CM | POA: Diagnosis present

## 2020-05-22 DIAGNOSIS — Y990 Civilian activity done for income or pay: Secondary | ICD-10-CM

## 2020-05-22 DIAGNOSIS — D62 Acute posthemorrhagic anemia: Secondary | ICD-10-CM | POA: Diagnosis present

## 2020-05-22 DIAGNOSIS — Z6832 Body mass index (BMI) 32.0-32.9, adult: Secondary | ICD-10-CM

## 2020-05-22 DIAGNOSIS — Z23 Encounter for immunization: Secondary | ICD-10-CM

## 2020-05-22 DIAGNOSIS — D72829 Elevated white blood cell count, unspecified: Secondary | ICD-10-CM

## 2020-05-22 DIAGNOSIS — Z79899 Other long term (current) drug therapy: Secondary | ICD-10-CM | POA: Diagnosis not present

## 2020-05-22 DIAGNOSIS — Z713 Dietary counseling and surveillance: Secondary | ICD-10-CM | POA: Diagnosis not present

## 2020-05-22 DIAGNOSIS — R7401 Elevation of levels of liver transaminase levels: Secondary | ICD-10-CM

## 2020-05-22 DIAGNOSIS — I10 Essential (primary) hypertension: Secondary | ICD-10-CM | POA: Diagnosis present

## 2020-05-22 DIAGNOSIS — E1165 Type 2 diabetes mellitus with hyperglycemia: Secondary | ICD-10-CM | POA: Diagnosis present

## 2020-05-22 DIAGNOSIS — S86819S Strain of other muscle(s) and tendon(s) at lower leg level, unspecified leg, sequela: Secondary | ICD-10-CM | POA: Diagnosis not present

## 2020-05-22 DIAGNOSIS — S76112S Strain of left quadriceps muscle, fascia and tendon, sequela: Secondary | ICD-10-CM | POA: Diagnosis present

## 2020-05-22 DIAGNOSIS — S76111D Strain of right quadriceps muscle, fascia and tendon, subsequent encounter: Secondary | ICD-10-CM | POA: Diagnosis not present

## 2020-05-22 DIAGNOSIS — I129 Hypertensive chronic kidney disease with stage 1 through stage 4 chronic kidney disease, or unspecified chronic kidney disease: Secondary | ICD-10-CM | POA: Diagnosis present

## 2020-05-22 DIAGNOSIS — S76111S Strain of right quadriceps muscle, fascia and tendon, sequela: Secondary | ICD-10-CM | POA: Diagnosis not present

## 2020-05-22 DIAGNOSIS — E669 Obesity, unspecified: Secondary | ICD-10-CM | POA: Diagnosis present

## 2020-05-22 LAB — GLUCOSE, CAPILLARY
Glucose-Capillary: 140 mg/dL — ABNORMAL HIGH (ref 70–99)
Glucose-Capillary: 159 mg/dL — ABNORMAL HIGH (ref 70–99)
Glucose-Capillary: 169 mg/dL — ABNORMAL HIGH (ref 70–99)

## 2020-05-22 MED ORDER — OXYCODONE HCL 5 MG PO TABS
10.0000 mg | ORAL_TABLET | ORAL | Status: DC | PRN
Start: 2020-05-22 — End: 2020-05-26

## 2020-05-22 MED ORDER — ALUM & MAG HYDROXIDE-SIMETH 200-200-20 MG/5ML PO SUSP: 30.0000 mL | ORAL | Status: DC | PRN

## 2020-05-22 MED ORDER — ENOXAPARIN SODIUM 40 MG/0.4ML ~~LOC~~ SOLN
40.0000 mg | SUBCUTANEOUS | Status: DC
  Administered 2020-05-23 – 2020-05-27 (×5): 40 mg via SUBCUTANEOUS
  Filled 2020-05-22 (×5): qty 0.4

## 2020-05-22 MED ORDER — PROCHLORPERAZINE MALEATE 5 MG PO TABS: 5.0000 mg | ORAL_TABLET | Freq: Four times a day (QID) | ORAL | Status: DC | PRN

## 2020-05-22 MED ORDER — SENNOSIDES-DOCUSATE SODIUM 8.6-50 MG PO TABS
1.0000 | ORAL_TABLET | Freq: Every day | ORAL | Status: DC
  Administered 2020-05-23 – 2020-06-05 (×14): 1 via ORAL
  Filled 2020-05-22 (×14): qty 1

## 2020-05-22 MED ORDER — PNEUMOCOCCAL VAC POLYVALENT 25 MCG/0.5ML IJ INJ
0.5000 mL | INJECTION | INTRAMUSCULAR | Status: AC
  Administered 2020-05-23: 0.5 mL via INTRAMUSCULAR
  Filled 2020-05-22: qty 0.5

## 2020-05-22 MED ORDER — SIMVASTATIN 20 MG PO TABS
20.0000 mg | ORAL_TABLET | Freq: Every day | ORAL | Status: DC
  Administered 2020-05-22 – 2020-06-05 (×15): 20 mg via ORAL
  Filled 2020-05-22 (×15): qty 1

## 2020-05-22 MED ORDER — ENOXAPARIN SODIUM 40 MG/0.4ML ~~LOC~~ SOLN: 40.0000 mg | SUBCUTANEOUS | 0 refills | Status: DC

## 2020-05-22 MED ORDER — PROCHLORPERAZINE EDISYLATE 10 MG/2ML IJ SOLN: 5.0000 mg | Freq: Four times a day (QID) | INTRAMUSCULAR | Status: DC | PRN

## 2020-05-22 MED ORDER — INSULIN ASPART 100 UNIT/ML ~~LOC~~ SOLN
0.0000 [IU] | Freq: Three times a day (TID) | SUBCUTANEOUS | Status: DC
  Administered 2020-05-22: 2 [IU] via SUBCUTANEOUS
  Administered 2020-05-23: 3 [IU] via SUBCUTANEOUS
  Administered 2020-05-23: 1 [IU] via SUBCUTANEOUS
  Administered 2020-05-24: 2 [IU] via SUBCUTANEOUS
  Administered 2020-05-26: 1 [IU] via SUBCUTANEOUS

## 2020-05-22 MED ORDER — FLEET ENEMA 7-19 GM/118ML RE ENEM: 1.0000 | ENEMA | Freq: Once | RECTAL | Status: DC | PRN

## 2020-05-22 MED ORDER — TRAMADOL HCL 50 MG PO TABS
50.0000 mg | ORAL_TABLET | Freq: Four times a day (QID) | ORAL | Status: DC
  Administered 2020-05-22 – 2020-06-01 (×38): 50 mg via ORAL
  Filled 2020-05-22 (×39): qty 1

## 2020-05-22 MED ORDER — ADULT MULTIVITAMIN W/MINERALS CH
1.0000 | ORAL_TABLET | Freq: Every day | ORAL | Status: DC
  Administered 2020-05-23 – 2020-06-06 (×15): 1 via ORAL
  Filled 2020-05-22 (×15): qty 1

## 2020-05-22 MED ORDER — BISACODYL 10 MG RE SUPP: 10.0000 mg | Freq: Every day | RECTAL | Status: DC | PRN

## 2020-05-22 MED ORDER — POLYETHYLENE GLYCOL 3350 17 G PO PACK: 17.0000 g | PACK | Freq: Every day | ORAL | Status: DC | PRN

## 2020-05-22 MED ORDER — METHOCARBAMOL 500 MG PO TABS: 500.0000 mg | ORAL_TABLET | Freq: Four times a day (QID) | ORAL | 0 refills | Status: DC | PRN

## 2020-05-22 MED ORDER — METHOCARBAMOL 1000 MG/10ML IJ SOLN
500.0000 mg | Freq: Four times a day (QID) | INTRAVENOUS | Status: DC | PRN
  Filled 2020-05-22: qty 5

## 2020-05-22 MED ORDER — DIPHENHYDRAMINE HCL 12.5 MG/5ML PO ELIX: 12.5000 mg | ORAL_SOLUTION | Freq: Four times a day (QID) | ORAL | Status: DC | PRN

## 2020-05-22 MED ORDER — ONDANSETRON HCL 4 MG PO TABS: 4.0000 mg | ORAL_TABLET | Freq: Four times a day (QID) | ORAL | 0 refills | Status: DC | PRN

## 2020-05-22 MED ORDER — POLYETHYLENE GLYCOL 3350 17 G PO PACK
17.0000 g | PACK | Freq: Every day | ORAL | Status: DC | PRN
  Administered 2020-06-03: 17 g via ORAL
  Filled 2020-05-22: qty 1

## 2020-05-22 MED ORDER — METHOCARBAMOL 500 MG PO TABS
500.0000 mg | ORAL_TABLET | Freq: Four times a day (QID) | ORAL | Status: DC | PRN
  Administered 2020-05-24 – 2020-05-29 (×2): 500 mg via ORAL
  Filled 2020-05-22 (×2): qty 1

## 2020-05-22 MED ORDER — LIVING WELL WITH DIABETES BOOK
Freq: Once | Status: AC
  Filled 2020-05-22: qty 1

## 2020-05-22 MED ORDER — PANTOPRAZOLE SODIUM 40 MG PO TBEC
40.0000 mg | DELAYED_RELEASE_TABLET | Freq: Every day | ORAL | Status: DC
  Administered 2020-05-23 – 2020-06-06 (×15): 40 mg via ORAL
  Filled 2020-05-22 (×16): qty 1

## 2020-05-22 MED ORDER — PROCHLORPERAZINE 25 MG RE SUPP: 12.5000 mg | Freq: Four times a day (QID) | RECTAL | Status: DC | PRN

## 2020-05-22 MED ORDER — ENOXAPARIN SODIUM 40 MG/0.4ML ~~LOC~~ SOLN: 40.0000 mg | SUBCUTANEOUS | Status: DC

## 2020-05-22 MED ORDER — OXYCODONE HCL 5 MG PO TABS: 5.0000 mg | ORAL_TABLET | Freq: Four times a day (QID) | ORAL | 0 refills | Status: DC | PRN

## 2020-05-22 MED ORDER — SENNOSIDES-DOCUSATE SODIUM 8.6-50 MG PO TABS
2.0000 | ORAL_TABLET | Freq: Every day | ORAL | Status: DC
  Filled 2020-05-22: qty 2

## 2020-05-22 MED ORDER — ENSURE MAX PROTEIN PO LIQD
11.0000 [oz_av] | Freq: Two times a day (BID) | ORAL | Status: DC
  Administered 2020-05-22 – 2020-05-30 (×15): 11 [oz_av] via ORAL
  Filled 2020-05-22 (×3): qty 330

## 2020-05-22 MED ORDER — GUAIFENESIN-DM 100-10 MG/5ML PO SYRP: 5.0000 mL | ORAL_SOLUTION | Freq: Four times a day (QID) | ORAL | Status: DC | PRN

## 2020-05-22 MED ORDER — ACETAMINOPHEN 325 MG PO TABS
325.0000 mg | ORAL_TABLET | ORAL | Status: DC | PRN
  Administered 2020-05-25: 650 mg via ORAL
  Filled 2020-05-22: qty 2

## 2020-05-22 MED ORDER — INSULIN ASPART 100 UNIT/ML ~~LOC~~ SOLN: 0.0000 [IU] | Freq: Every day | SUBCUTANEOUS | Status: DC

## 2020-05-22 MED ORDER — DOCUSATE SODIUM 100 MG PO CAPS: 100.0000 mg | ORAL_CAPSULE | Freq: Two times a day (BID) | ORAL | 0 refills | Status: DC

## 2020-05-22 MED ORDER — TRAZODONE HCL 50 MG PO TABS
25.0000 mg | ORAL_TABLET | Freq: Every evening | ORAL | Status: DC | PRN
  Administered 2020-06-03: 50 mg via ORAL
  Filled 2020-05-22 (×2): qty 1

## 2020-05-22 MED ORDER — IRBESARTAN 300 MG PO TABS
150.0000 mg | ORAL_TABLET | Freq: Every day | ORAL | Status: DC
  Administered 2020-05-23 – 2020-05-30 (×8): 150 mg via ORAL
  Filled 2020-05-22 (×8): qty 1

## 2020-05-22 NOTE — H&P (Shared)
Physical Medicine and Rehabilitation Admission H&P    Chief Complaint  Patient presents with  . Functional deficits     Bilat L and R quad tendon rupture    HPI:  Arthur Ayers is a 55 year old male with history of HTN, GERD, HNP cervical/lumbar spine; who injured his leg while getting out of his [employer] truck with inability to WB or stand on BLE on 05/13/20. He was found to have left patella fracture with displacement, complete quad tendon tear with large knee joint effusion and hematoma on the left and  and quad tendon avulsion on the left and osseous contusion of posterior medial tibial plateau with partial thickness tear of quad tendon, intrasubstance sprain of lateral collateral ligament nd lateral patellar retinaculum as well as large knee effusion with hematoma and debris. He was taken to OR for repair of bilateral quad tendon by Dr. Eulah PontMurphy on 01/30. Post op WBAT with KI in place and SQ lovenox -->ASA 81 mg bid at discharge for DVT prophylaxis.  Dressing to be left intact for 2 weeks till follow up and to keep knees in full extension.  He has had issues with pain, GERD, acute blood loss anemia, hyponatremia, leucocytosis as well as fasting hyperglycemia. Pain control is improving with tramadol qid and has been able to tolerate activity. Therapy has been working with patient and CIR recommended due to functional decline.   Review of Systems  Constitutional: Negative for chills and fever.  HENT: Negative for hearing loss and tinnitus.   Eyes: Negative for blurred vision and double vision.  Respiratory: Negative for cough and shortness of breath.   Cardiovascular: Negative for chest pain, palpitations and leg swelling.  Gastrointestinal: Positive for diarrhea. Negative for heartburn and nausea.  Genitourinary: Negative for dysuria and urgency.  Musculoskeletal: Positive for joint pain (hip pain ) and myalgias.  Skin: Negative for itching and rash.  Neurological: Positive for weakness  and headaches (due to DDD cervical spine).  Psychiatric/Behavioral: The patient has insomnia.      Past Medical History:  Diagnosis Date  . Acute pain of right knee 05/13/2020  . GERD (gastroesophageal reflux disease)   . Hypercholesteremia   . Hypertension     Past Surgical History:  Procedure Laterality Date  . QUADRICEPS TENDON REPAIR Bilateral 05/13/2020   Procedure: REPAIR QUADRICEP TENDON;  Surgeon: Sheral ApleyMurphy, Timothy D, MD;  [facility] OR;  Service: Orthopedics;  Laterality: Bilateral;  . SHOULDER SURGERY     Rotator Cuff Repair in 2015 R shoulder  . TONSILLECTOMY      Family History  Problem Relation Age of Onset  . Diabetes Mother   . Thyroid disease Sister      Social History:  Married. Independent and working PTA. He reports that he has never smoked. He has never used smokeless tobacco. He reports current alcohol use--beer occasionally. Marland Kitchen. He reports that he does not use drugs.    Allergies: No Known Allergies    Medications Prior to Admission  Medication Sig Dispense Refill  . acetaminophen (TYLENOL) 500 MG tablet Take 1,000 mg by mouth every 6 (six) hours as needed for mild pain.    . Boswellia-Glucosamine-Vit D (OSTEO BI-FLEX-GLUCOS/5-LOXIN) TABS Take 2 tablets by mouth at bedtime.    . cetirizine (ZYRTEC) 10 MG tablet Take 10 mg by mouth daily as needed for allergies.    Marland Kitchen. co-enzyme Q-10 30 MG capsule Take 30 mg by mouth daily.    Marland Kitchen. docusate sodium (COLACE) 100  MG capsule Take 1 capsule (100 mg total) by mouth 2 (two) times daily. 10 capsule 0  . enoxaparin (LOVENOX) 40 MG/0.4ML injection Inject 0.4 mLs (40 mg total) into the skin daily for 20 days. To prevent blood clots after surgery 8 mL 0  . esomeprazole (NEXIUM) 40 MG capsule Take 40 mg by mouth daily.    . methocarbamol (ROBAXIN) 500 MG tablet Take 1 tablet (500 mg total) by mouth every 6 (six) hours as needed for muscle spasms. 40 tablet 0  . Multiple Vitamin (MULTIVITAMIN) tablet Take 1 tablet by mouth  every morning.    . Omega-3 Fatty Acids (FISH OIL) 1000 MG CAPS Take 1,000 mg by mouth daily.    . ondansetron (ZOFRAN) 4 MG tablet Take 1 tablet (4 mg total) by mouth every 6 (six) hours as needed for nausea. 20 tablet 0  . oxyCODONE (ROXICODONE) 5 MG immediate release tablet Take 1 tablet (5 mg total) by mouth every 6 (six) hours as needed for severe pain. 28 tablet 0  . simvastatin (ZOCOR) 20 MG tablet Take 20 mg by mouth daily at 6 PM.    . telmisartan (MICARDIS) 40 MG tablet Take 40 mg by mouth daily.      Drug Regimen Review { DRUG REGIMEN [id number]}  Home: Home Living Family/patient expects to be discharged to:: Private residence Living Arrangements: Spouse/significant other,Children Available Help at Discharge: Family,Available 24 hours/day Type of Home: House Home Access: Stairs to enter [employer] of Steps: 2 in garage, 3 in front Entrance Stairs-Rails: None Home Layout: Multi-level,1/2 bath on main level Alternate Level Stairs-Number of Steps: flight Alternate Level Stairs-Rails: Left Bathroom Shower/Tub: Health visitor: Pharmacist, community: Yes Home Equipment: None Additional Comments: works for [employer]  Lives With: Spouse,Daughter   Functional History: Prior Function Level of Independence: Independent Comments: has had R shoulder surgery in the past  Functional Status:  Mobility: Bed Mobility Overal bed mobility: Modified Independent Bed Mobility: Supine to Sit Rolling: Min assist Supine to sit: Modified independent (Device/Increase time),HOB elevated Sit to supine: Modified independent (Device/Increase time),HOB elevated (long sitting to supine) General bed mobility comments: Pt able to come to EOB with Mod I using bed railings and HOB elevated. Transfers Overall transfer level: Needs assistance Equipment used: Rolling walker (2 wheeled) Transfers: Sit to/from Stand Sit to Stand: From elevated surface,Min  assist Anterior-Posterior transfers: Min assist  Lateral/Scoot Transfers: Min assist,With slide board,From elevated surface General transfer comment: pt demonstrates increased trunk flexion to facilitate standing and maintain knee extension Ambulation/Gait Ambulation/Gait assistance: Min guard,+2 safety/equipment Gait Distance (Feet): 80 Feet Assistive device: Rolling walker (2 wheeled) Gait Pattern/deviations: Step-to pattern,Trunk flexed,Shuffle General Gait Details: Asked pt if he felt he could walk further than to the bathroom and back (when we walk this is how far he has gone).  He reports he could not due to pain in bil legs.  This is is first time up on his new bledsoe braces which are locked in extension. Gait velocity: decreased Gait velocity interpretation: <1.31 ft/sec, indicative of household ambulator Stairs: Yes Stairs assistance: Min assist,+2 physical assistance Stair Management: No rails,Backwards,With walker Number of Stairs: 2 General stair comments: Practiced small 4" steps (low side of practice stairs in ortho gym) with pt and his wife.  PT first visually demonstrated, provided handout and then pt, therapist and wife practiced goin gup and down two small steps with RW backwards.  Heavy min assist with wife stabilizing RW and therapist supporting pt and assisting  in stabilizing the RW from behind.  Pt had significant pain and difficulty attempting these small stairs, so I am not sure how getting up/down larger stairs will go.  He may need to rent a ramp for home access until he is allowed to bend his knees Wheelchair Mobility Wheelchair mobility: Yes Wheelchair propulsion: Both upper extremities Wheelchair parts: Needs assistance Distance: 300 Wheelchair Assistance Details (indicate cue type and reason): WC left in room for him to practice lateral transfers tomorrow.  ADL: ADL Overall ADL's : Needs assistance/impaired Eating/Feeding: Sitting,Independent Eating/Feeding  Details (indicate cue type and reason): Pt able to bring cup of water to mouth and drink. Grooming: Wash/dry hands,Standing,Minimal assistance,Wash/dry face,Brushing hair,Sitting,Set up Grooming Details (indicate cue type and reason): Pt completed handwashing at the sink with min A for safety, All other grooming was completed sitting in recliner with setup. Upper Body Bathing: Sitting,Minimal assistance Upper Body Bathing Details (indicate cue type and reason): Pt able to wash upper body with set up, OT assisted with reaching back. Lower Body Bathing: Moderate assistance,Cueing for safety,Cueing for compensatory techniques,Sit to/from stand Lower Body Bathing Details (indicate cue type and reason): Pt able to complete pericare standing at toilet, with OT assisting for thoroughness. Upper Body Dressing : Set up,Sitting Upper Body Dressing Details (indicate cue type and reason): Pt doffed and donned hospital gown seated in recliner. Lower Body Dressing: Maximal assistance Lower Body Dressing Details (indicate cue type and reason): Pt unable to reach feet to put on socks Toilet Transfer: Moderate assistance,BSC,RW Toilet Transfer Details (indicate cue type and reason): Pt required mod assist to sit and stand from [facility] over toilet. OT assisted postioning by providing trashcan under feet to keep knees in extension while toileting. Toileting- Clothing Manipulation and Hygiene: Moderate assistance,Sit to/from stand Toileting - Clothing Manipulation Details (indicate cue type and reason): Pt required assist for thoroughness Functional mobility during ADLs: Min guard,Rolling walker General ADL Comments: Pt demonstrated difficulty with lowering down to sit on both elevated toilet seat and elevated recliner seat. With mod assist pt is able to complete stand/sit. Pt demonstrated increased tolerance to ambulating in the room w/ RW.  Cognition: Cognition Overall Cognitive Status: Within Functional Limits for  tasks assessed Orientation Level: Oriented X4 Cognition Arousal/Alertness: Awake/alert Behavior During Therapy: WFL for tasks assessed/performed Overall Cognitive Status: Within Functional Limits for tasks assessed General Comments: Pt reported pain was at a 1 prior to functional mobility. With mobility, pt reported max pain was 5.   Blood pressure 122/61, pulse 72, temperature (!) 97.5 F (36.4 C), resp. rate 19, height 5\' 9"  (1.753 m), weight 99.8 kg, SpO2 99 %. Physical Exam  No results found for this or any previous visit (from the past 48 hour(s)). No results found.     Medical Problem List and Plan: 1.  *** secondary to ***  -patient may *** shower  -ELOS/Goals: *** 2.  Antithrombotics: -DVT/anticoagulation:  Pharmaceutical: Lovenox  -antiplatelet therapy: N/A 3. Pain Management:  Continue ultram 50 mg qid w/Oxycodone prn -->effective.  4. Mood: LCSW to follow for evaluation and support.   -antipsychotic agents: N/A 5. Neuropsych: This patient is capable of making decisions on his own behalf. 6. Skin/Wound Care: Dressings to stay in place for 2 weeks post op till follow up with ortho.   --Routine pressure relief measures. Monitor for drainage.   7. Fluids/Electrolytes/Nutrition: Monitor I/O. Check CMET in am. 8. HTN: Monitor BP TID--continue irbesartan,  9. GERD: Continue Protonix. 10. Leucocytosis: Monitor for fevers and signs of  infection. 11. New diagnosis T2DM: has been pre-diabetic. Now with Hgb A1C-7.1.    --Change diet to Carb Modified.  Consult RD for dietary education    --Monitor BS ac/hs and use SSI for elevated BS.  12. ABLA:  Recheck CBC in am. Stabilizing to mid 11,000 13. Hyponatremia: Recheck CMET in am.  14. Hypoalbuminemia:  Protein levels trending down.   --Will add Ensure Max supplements with meals to promote healing.   ***  Jacquelynn Cree, PA-C 05/22/2020

## 2020-05-22 NOTE — Discharge Instructions (Signed)
POST-OPERATIVE OPIOID TAPER INSTRUCTIONS: . It is important to wean off of your opioid medication as soon as possible. If you do not need pain medication after your surgery it is ok to stop day one. . Opioids include: o Codeine, Hydrocodone(Norco, Vicodin), Oxycodone(Percocet, oxycontin) and hydromorphone amongst others.  . Long term and even short term use of opiods can cause: o Increased pain response o Dependence o Constipation o Depression o Respiratory depression o And more.  . Withdrawal symptoms can include o Flu like symptoms o Nausea, vomiting o And more . Techniques to manage these symptoms o Hydrate well o Eat regular healthy meals o Stay active o Use relaxation techniques(deep breathing, meditating, yoga) . Do Not substitute Alcohol to help with tapering . If you have been on opioids for less than two weeks and do not have pain than it is ok to stop all together.  . Plan to wean off of opioids o This plan should start within one week post op of your joint replacement. o Maintain the same interval or time between taking each dose and first decrease the dose.  o Cut the total daily intake of opioids by one tablet each day o Next start to increase the time between doses. o The last dose that should be eliminated is the evening dose.    

## 2020-05-22 NOTE — Progress Notes (Signed)
Inpatient Rehabilitation Medication Review by a Pharmacist  A complete drug regimen review was completed for this patient to identify any potential clinically significant medication issues.  Clinically significant medication issues were identified:  No  Check AMION for pharmacist assigned to patient if future medication questions/issues arise during this admission.  Time spent performing this drug regimen review (minutes):  20  Vicki Mallet, PharmD, BCPS, [facility] Clinical Pharmacist 05/22/2020 3:13 PM

## 2020-05-22 NOTE — Progress Notes (Addendum)
IP rehab admissions - We have authorization from workers comp for CIR admission.  Will need medical clearance from attending MD and then can admit to inpatient rehab today.  I have called MD office and left a message.  Call me for questions.  [phone]  11:51 I have clearance from ortho team and will admit to inpatient rehab today.

## 2020-05-22 NOTE — IPOC Note (Signed)
Individualized overall Plan of Care (IPOC) Patient Details Name: Arthur Ayers MRN: [id number] DOB: [birth year]  Admitting Diagnosis: Patellar tendon rupture, unspecified laterality, sequela  Hospital Problems: Principal Problem:   Patellar tendon rupture, unspecified laterality, sequela Active Problems:   CKD (chronic kidney disease) stage 2, GFR 60-89 ml/min   Essential hypertension   Type II diabetes mellitus (HCC)   Diabetes mellitus, new onset (HCC)   Hyponatremia   Leukocytosis     Functional Problem List: Nursing Medication Management,Safety,Endurance,Pain,Skin Integrity  PT Balance,Endurance,Motor,Safety,Pain  OT Balance,Endurance,Skin Integrity,Motor  SLP    TR         Basic ADL's: OT Grooming,Bathing,Dressing,Toileting     Advanced  ADL's: OT       Transfers: PT Bed Mobility,Bed to [employer]  OT Toilet     Locomotion: PT Ambulation,Wheelchair Mobility     Additional Impairments: OT None  SLP        TR      Anticipated Outcomes Item Anticipated Outcome  Self Feeding ind  Swallowing      Basic self-care  S  Toileting  S   Bathroom Transfers S  Bowel/Bladder     Transfers  Supervision  Locomotion  Supervision with LRAD  Communication     Cognition     Pain  Manage pain at or below level 4  Safety/Judgment  Maintain safety with cues/reminders   Therapy Plan: PT Intensity: Minimum of 1-2 x/day ,45 to 90 minutes PT Frequency: 5 out of 7 days PT Duration Estimated Length of Stay: 5-7 days OT Intensity: Minimum of 1-2 x/day, 45 to 90 minutes OT Frequency: 5 out of 7 days OT Duration/Estimated Length of Stay: 7-9 days      Team Interventions: Nursing Interventions Pain Management,Patient/Family Education,Medication Management,Disease Management/Prevention,Discharge Planning,Skin Care/Wound Management  PT interventions Ambulation/gait training,Discharge planning,DME/adaptive equipment instruction,Functional mobility training,Pain  management,Therapeutic Activities,UE/LE Strength taining/ROM,Wheelchair propulsion/positioning,Therapeutic Exercise,Stair training,Patient/family education,Balance/vestibular training,Neuromuscular re-education  OT Interventions Balance/vestibular training,Community reintegration,Disease mangement/prevention,Neuromuscular re-education,Patient/family education,Self Care/advanced ADL retraining,Therapeutic Exercise,UE/LE Psychiatrist propulsion/positioning,Discharge planning,DME/adaptive equipment instruction,Functional mobility training,Pain management,Psychosocial support,Skin care/wound managment,Therapeutic Activities,UE/LE Strength taining/ROM  SLP Interventions    TR Interventions    SW/CM Interventions Discharge Planning,Psychosocial Support,Patient/Family Education   Barriers to Discharge MD  Medical stability, Wound care, and Weight bearing restrictions  Nursing Decreased caregiver support,Home environment access/layout,Other (comments) multi level home 2 step garage entry; no rails and 1/2 bath main, bathroom up flight of stairs  PT Home environment access/layout lives in multilevel home, bed and full bath upstairs  OT      SLP      SW       Team Discharge Planning: Destination: PT-Home ,OT- Home , SLP-  Projected Follow-up: PT-Home health PT, OT-  Home health OT, SLP-  Projected Equipment Needs: PT-3 in 1 bedside comode,Wheelchair (measurements),Wheelchair cushion (measurements),Rolling walker with 5" wheels, OT- 3 in 1 bedside comode, SLP-  Equipment Details: PT-bariatric RW, 20x18 w/c with ELR's, OT-  Patient/family involved in discharge planning: PT- Patient,  OT-Patient, SLP-   MD ELOS: 4-6 days. Medical Rehab Prognosis:  Excellent Assessment: 55 year old male with history of HTN, GERD, DDD lumbar spine; who injured his leg while getting out of his [employer] truck with inability to WB or stand on BLE on 05/13/20. He was found to have left patella fracture with  displacement, complete quad tendon tear with large knee joint effusion and hematoma on the left and  and quad tendon avulsion on the left and osseous contusion of posterior medial tibial plateau with  partial thickness tear of quad tendon, intrasubstance sprain of lateral collateral ligament nd lateral patellar retinaculum as well as large knee effusion with hematoma and debris. He was taken to OR for repair of bilateral quad tendon by Dr. Eulah Pont on 01/30. Post op WBAT with KI in place and SQ lovenox -->ASA 81 mg bid at discharge for DVT prophylaxis.  Dressing to be left intact for 2 weeks till follow up and to keep knees in full extension.  He has had issues with pain, GERD, acute blood loss anemia, hyponatremia, leucocytosis as well as fasting hyperglycemia. Pain control is improving with tramadol qid and has been able to tolerate activity. Patient with resulting functional deficits with mobility, transfers, self-care.  Will set goals for Supervision with PT/OT.   Due to the current state of emergency, patients may not be receiving their 3-hours of Medicare-mandated therapy.  See Team Conference Notes for weekly updates to the plan of care

## 2020-05-22 NOTE — Progress Notes (Signed)
Patient to rehab per wheelchair accompanied by RN and family. Bledsoe knee brace noted. Gavin Pound RN assisted with skin assessment. Fall precaution bundle discussed and signed by patient. Patient and family oriented to unit set up.

## 2020-05-22 NOTE — Progress Notes (Signed)
Courtney Heys, MD  Physician  Physical Medicine and Rehabilitation  PMR Pre-admission     Signed  Date of Service:  05/20/2020 5:43 PM      Related encounter: ED to Hosp-Admission (Discharged) from 05/12/2020 in [place]          Show:Clear all [x] Manual[x] Template[x] Copied  Added by: [x] Lind Covert, Runell Gess, CCC-SLP[x] Retta Diones, RN[x] Courtney Heys, MD   [] Hover for details  PMR Admission Coordinator Pre-Admission Assessment  Patient: Arthur Ayers is an 55 y.o., male MRN: [id number] DOB: February 28, 1965 Height: 5' 9"  (175.3 cm) Weight: 99.8 kg  Insurance Information HMO:     PPO:      PCP:      IPA:      80/20:      OTHER:  PRIMARY: [employer] Comp)      Policy#: [id number]      Subscriber: [id number] [place] Name: Jeanie Sewer      Phone#: [phone]  Fax#: [phone] Pre-Cert#: [id number]      Employer: [employer] Benefits:  Phone #:      Name:  Irene Shipper. Date:      Deduct:       Out of Pocket Max:       Life Max:  CIR:       SNF:  Outpatient:      Co-Pay:  Home Health:       Co-Pay:  DME:      Co-Pay:  Providers: Adjuster is Elesa Hacker at fax [phone] and phone [phone]  SECONDARY:       Policy#:      Phone#:   Development worker, community:       Phone#:   The Therapist, art Information Summary" for patients in Inpatient Rehabilitation Facilities with attached "Privacy Act [place] Records" was provided and verbally reviewed with: N/A  Emergency Contact Information         Contact Information    Name Relation Home Work Mobile   Harshbarger,Sherri Spouse [phone]  [phone]      Current Medical History  Patient Admitting Diagnosis: Traumatic rupture of bilateral quadriceps tendon  History of Present Illness: A 55 y.o. male admitted on 05/12/20 after a fall from truck with bilateral patellar tendon injury s/p bil quad tendon repair on day of admission. Pt WBAT  post op, no knee flexion ROM, KI bil at all times. Pt with significant PMH of R shoulder surgery (RTC repair 2015), low back bulging disc, HTN.  PT/OT evaluations were completed with recommendations for acute inpatient rehab admission.    Patient's medical record from [facility] has been reviewed by the rehabilitation admission coordinator and physician.  Past Medical History      Past Medical History:  Diagnosis Date  . Acute pain of right knee 05/13/2020  . GERD (gastroesophageal reflux disease)   . Hypercholesteremia   . Hypertension     Family History   family history is not on file.  Prior Rehab/Hospitalizations Has the patient had prior rehab or hospitalizations prior to admission? No  Has the patient had major surgery during 100 days prior to admission? Yes             Current Medications  Current Facility-Administered Medications:  .  acetaminophen (TYLENOL) tablet 325-650 mg, 325-650 mg, Oral, Q6H PRN, Britt Bottom, PA-C, 650 mg at 05/17/20 2028 .  bisacodyl (DULCOLAX) suppository 10 mg, 10 mg, Rectal, Daily PRN, Aggie Moats M,  PA-C .  diphenhydrAMINE (BENADRYL) 12.5 MG/5ML elixir 12.5-25 mg, 12.5-25 mg, Oral, Q4H PRN, Aggie Moats M, PA-C, 25 mg at 05/18/20 0931 .  docusate sodium (COLACE) capsule 100 mg, 100 mg, Oral, BID, Aggie Moats M, PA-C, 100 mg at 05/22/20 1779 .  enoxaparin (LOVENOX) injection 40 mg, 40 mg, Subcutaneous, Q24H, Gawne, Meghan M, PA-C, 40 mg at 05/21/20 1125 .  HYDROmorphone (DILAUDID) injection 0.5-1 mg, 0.5-1 mg, Intravenous, Q4H PRN, Aggie Moats M, PA-C, 1 mg at 05/14/20 1407 .  irbesartan (AVAPRO) tablet 150 mg, 150 mg, Oral, Daily, Aggie Moats M, PA-C, 150 mg at 05/22/20 0903 .  magnesium citrate solution 1 Bottle, 1 Bottle, Oral, Once PRN, Gawne, Meghan M, PA-C .  methocarbamol (ROBAXIN) tablet 500 mg, 500 mg, Oral, Q6H PRN, 500 mg at 05/21/20 2043 **OR** methocarbamol (ROBAXIN) 500 mg in dextrose 5 % 50 mL IVPB,  500 mg, Intravenous, Q6H PRN, Gawne, Meghan M, PA-C .  metoCLOPramide (REGLAN) tablet 5-10 mg, 5-10 mg, Oral, Q8H PRN **OR** metoCLOPramide (REGLAN) injection 5-10 mg, 5-10 mg, Intravenous, Q8H PRN, Gawne, Meghan M, PA-C .  multivitamin with minerals tablet 1 tablet, 1 tablet, Oral, Daily, Aggie Moats M, PA-C, 1 tablet at 05/22/20 0904 .  ondansetron (ZOFRAN) tablet 4 mg, 4 mg, Oral, Q6H PRN **OR** ondansetron (ZOFRAN) injection 4 mg, 4 mg, Intravenous, Q6H PRN, Gawne, Meghan M, PA-C .  oxyCODONE (Oxy IR/ROXICODONE) immediate release tablet 10-15 mg, 10-15 mg, Oral, Q4H PRN, Aggie Moats M, PA-C, 15 mg at 05/20/20 0428 .  oxyCODONE (Oxy IR/ROXICODONE) immediate release tablet 5-10 mg, 5-10 mg, Oral, Q4H PRN, Aggie Moats M, PA-C, 10 mg at 05/18/20 2158 .  pantoprazole (PROTONIX) EC tablet 40 mg, 40 mg, Oral, Daily, Aggie Moats M, PA-C, 40 mg at 05/22/20 0904 .  polyethylene glycol (MIRALAX / GLYCOLAX) packet 17 g, 17 g, Oral, Daily PRN, Aggie Moats M, PA-C, 17 g at 05/16/20 0845 .  simvastatin (ZOCOR) tablet 20 mg, 20 mg, Oral, q1800, Aggie Moats M, PA-C, 20 mg at 05/21/20 1703 .  traMADol (ULTRAM) tablet 50 mg, 50 mg, Oral, Q6H, Gawne, Meghan M, PA-C, 50 mg at 05/22/20 3903  Patients Current Diet:     Diet Order                  Diet regular Room service appropriate? Yes; Fluid consistency: Thin  Diet effective now                  Precautions / Restrictions Precautions Precautions: Fall,Knee Precaution Booklet Issued: No Precaution Comments: no knee flexion ROM Restrictions Weight Bearing Restrictions: Yes RLE Weight Bearing: Weight bearing as tolerated LLE Weight Bearing: Weight bearing as tolerated Other Position/Activity Restrictions: no knee flexion ROM   Has the patient had 2 or more falls or a fall with injury in the past year? Yes  Prior Activity Level Community (5-7x/wk): working, driving  Prior Functional Level Self Care: Did the patient need  help bathing, dressing, using the toilet or eating? Independent  Indoor Mobility: Did the patient need assistance with walking from room to room (with or without device)? Independent  Stairs: Did the patient need assistance with internal or external stairs (with or without device)? Independent  Functional Cognition: Did the patient need help planning regular tasks such as shopping or remembering to take medications? Independent  Home Assistive Devices / Equipment Home Assistive Devices/Equipment: None Home Equipment: None  Prior Device Use: Indicate devices/aids used by the patient prior to current illness, exacerbation  or injury? None of the above  Current Functional Level Cognition  Overall Cognitive Status: Within Functional Limits for tasks assessed Orientation Level: Oriented X4 General Comments: Pt reported pain was at a 1 prior to functional mobility. With mobility, pt reported max pain was 5.    Extremity Assessment (includes Sensation/Coordination)  Upper Extremity Assessment: Overall WFL for tasks assessed RUE Deficits / Details: WNL AROM, pt reports some residual deficits in R shoulder mainly limited in amount of resistance in overhead position ("I can only lift up 25#")  Lower Extremity Assessment: Defer to PT evaluation    ADLs  Overall ADL's : Needs assistance/impaired Eating/Feeding: Sitting,Independent Eating/Feeding Details (indicate cue type and reason): Pt able to bring cup of water to mouth and drink. Grooming: Wash/dry hands,Standing,Minimal assistance,Wash/dry face,Brushing hair,Sitting,Set up Grooming Details (indicate cue type and reason): Pt completed handwashing at the sink with min A for safety, All other grooming was completed sitting in recliner with setup. Upper Body Bathing: Sitting,Minimal assistance Upper Body Bathing Details (indicate cue type and reason): Pt able to wash upper body with set up, OT assisted with reaching back. Lower Body  Bathing: Moderate assistance,Cueing for safety,Cueing for compensatory techniques,Sit to/from stand Lower Body Bathing Details (indicate cue type and reason): Pt able to complete pericare standing at toilet, with OT assisting for thoroughness. Upper Body Dressing : Set up,Sitting Upper Body Dressing Details (indicate cue type and reason): Pt doffed and donned hospital gown seated in recliner. Lower Body Dressing: Maximal assistance Lower Body Dressing Details (indicate cue type and reason): Pt unable to reach feet to put on socks Toilet Transfer: Moderate assistance,BSC,RW Toilet Transfer Details (indicate cue type and reason): Pt required mod assist to sit and stand from [facility] over toilet. OT assisted postioning by providing trashcan under feet to keep knees in extension while toileting. Toileting- Clothing Manipulation and Hygiene: Moderate assistance,Sit to/from stand Toileting - Clothing Manipulation Details (indicate cue type and reason): Pt required assist for thoroughness Functional mobility during ADLs: Min guard,Rolling walker General ADL Comments: Pt demonstrated difficulty with lowering down to sit on both elevated toilet seat and elevated recliner seat. With mod assist pt is able to complete stand/sit. Pt demonstrated increased tolerance to ambulating in the room w/ RW.    Mobility  Overal bed mobility: Modified Independent Bed Mobility: Supine to Sit Rolling: Min assist Supine to sit: Modified independent (Device/Increase time),HOB elevated Sit to supine: Modified independent (Device/Increase time),HOB elevated (long sitting to supine) General bed mobility comments: Pt able to come to EOB with Mod I using bed railings and HOB elevated.    Transfers  Overall transfer level: Needs assistance Equipment used: Rolling walker (2 wheeled) Transfers: Sit to/from Stand Sit to Stand: From elevated surface,Min assist Anterior-Posterior transfers: Min assist  Lateral/Scoot Transfers:  Min assist,With slide board,From elevated surface General transfer comment: pt demonstrates increased trunk flexion to facilitate standing and maintain knee extension    Ambulation / Gait / Stairs / Wheelchair Mobility  Ambulation/Gait Ambulation/Gait assistance: Min guard,+2 safety/equipment Gait Distance (Feet): 80 Feet Assistive device: Rolling walker (2 wheeled) Gait Pattern/deviations: Step-to pattern,Trunk flexed,Shuffle General Gait Details: Asked pt if he felt he could walk further than to the bathroom and back (when we walk this is how far he has gone).  He reports he could not due to pain in bil legs.  This is is first time up on his new bledsoe braces which are locked in extension. Gait velocity: decreased Gait velocity interpretation: <1.31 ft/sec, indicative of household ambulator  Stairs: Yes Stairs assistance: Min assist,+2 physical assistance Stair Management: No rails,Backwards,With walker Number of Stairs: 2 General stair comments: Practiced small 4" steps (low side of practice stairs in ortho gym) with pt and his wife.  PT first visually demonstrated, provided handout and then pt, therapist and wife practiced goin gup and down two small steps with RW backwards.  Heavy min assist with wife stabilizing RW and therapist supporting pt and assisting in stabilizing the RW from behind.  Pt had significant pain and difficulty attempting these small stairs, so I am not sure how getting up/down larger stairs will go.  He may need to rent a ramp for home access until he is allowed to bend his knees Wheelchair Mobility Wheelchair mobility: Yes Wheelchair propulsion: Both upper extremities Wheelchair parts: Needs assistance Distance: 300 Wheelchair Assistance Details (indicate cue type and reason): WC left in room for him to practice lateral transfers tomorrow.    Posture / Balance Dynamic Sitting Balance Sitting balance - Comments: Pt able to maintain long sit as well as sitting EOB  w/ feet out in front supported on the floor. Balance Overall balance assessment: Needs assistance Sitting-balance support: Feet supported Sitting balance-Leahy Scale: Good Sitting balance - Comments: Pt able to maintain long sit as well as sitting EOB w/ feet out in front supported on the floor. Standing balance support: Single extremity supported Standing balance-Leahy Scale: Poor Standing balance comment: relies on bil UE on RW, momentarily was able to use single UE to don face mask    Special needs/care consideration Continuous Drip IV  0.9% sodium chloride infusion: 100 mL/hr, Skin Surgical incision: leg/right, left and Designated visitor Megan Hayduk, wife   Previous Home Environment (from acute therapy documentation) Living Arrangements: Spouse/significant other,Children  Lives With: Spouse,Daughter Available Help at Discharge: Family,Available 24 hours/day Type of Home: House Home Layout: Multi-level,1/2 bath on main level Alternate Level Stairs-Rails: Left Alternate Level Stairs-Number of Steps: flight Home Access: Stairs to enter Entrance Stairs-Rails: None Entrance Stairs-Number of Steps: 2 in garage, 3 in front [employer] Shower/Tub: Multimedia programmer: Standard Bathroom Accessibility: Yes How Accessible: Accessible via walker Home Care Services: No Additional Comments: works for [employer]  Discharge Living Setting Plans for Discharge Living Setting: Patient's home Type of Home at Discharge: House Discharge Home Layout: Multi-level,1/2 bath on main level Alternate Level Stairs-Rails: Left Alternate Level Stairs-Number of Steps: flight Discharge Home Access: Stairs to enter Entrance Stairs-Rails: None Entrance Stairs-Number of Steps: 2 in garage, 3 in front Discharge Bathroom Shower/Tub: Walk-in shower Discharge Bathroom Toilet: Standard Discharge Bathroom Accessibility: Yes How Accessible: Accessible via walker Does the patient have any problems obtaining  your medications?: No  Social/Family/Support Systems Anticipated Caregiver: Conway Fedora, wife Anticipated Caregiver's Contact Information: [phone] Caregiver Availability: 24/7 Does Caregiver/Family have Issues with Lodging/Transportation while Pt is in Rehab?: No  Goals Patient/Family Goal for Rehab: PT/OT mod I and supervision goals Expected length of stay: 7-10 days Pt/Family Agrees to Admission and willing to participate: Yes Program Orientation Provided & Reviewed with Pt/Caregiver Including Roles  & Responsibilities: Yes  Decrease burden of Care through IP rehab admission: NA         Possible need for SNF placement upon discharge: NA  Patient Condition: I have reviewed medical records from [facility], spoken with CM, and patient. I met with patient at the bedside for inpatient rehabilitation assessment.  Patient will benefit from ongoing PT and OT, can actively participate in 3 hours of therapy a day 5 days of  the week, and can make measurable gains during the admission.  Patient will also benefit from the coordinated team approach during an Inpatient Acute Rehabilitation admission.  The patient will receive intensive therapy as well as Rehabilitation physician, nursing, social worker, and care management interventions.  Due to bladder management, bowel management, safety, skin/wound care, disease management, medication administration, pain management and patient education the patient requires 24 hour a day rehabilitation nursing.  The patient is currently min to mod assist with mobility and basic ADLs.  Discharge setting and therapy post discharge at home with home health is anticipated.  Patient has agreed to participate in the Acute Inpatient Rehabilitation Program and will admit today.  Preadmission Screen Completed By:  Retta Diones, 05/22/2020 11:50 AM ______________________________________________________________________   Discussed status with Dr. Dagoberto Ligas and Dr. Naaman Plummer  on 05/22/20 at 1000 and received approval for admission today.  Admission Coordinator:  Retta Diones, RN, time 1148/Date 05/22/20   Assessment/Plan: Diagnosis: 1. Does the need for close, 24 hr/day Medical supervision in concert with the patient's rehab needs make it unreasonable for this patient to be served in a less intensive setting? Yes 2. Co-Morbidities requiring supervision/potential complications: HTN, post op pain, /L quad tendon repair- WBAT with no flexion/Bledsoes B/L, LBP, RTC repair 2015- still painful 3. Due to bladder management, bowel management, safety, skin/wound care, disease management, medication administration, pain management and patient education, does the patient require 24 hr/day rehab nursing? Yes 4. Does the patient require coordinated care of a physician, rehab nurse, PT, OT, and SLP to address physical and functional deficits in the context of the above medical diagnosis(es)? Yes Addressing deficits in the following areas: balance, endurance, locomotion, strength, transferring, bathing, dressing, feeding, grooming and toileting 5. Can the patient actively participate in an intensive therapy program of at least 3 hrs of therapy 5 days a week? Yes 6. The potential for patient to make measurable gains while on inpatient rehab is good 7. Anticipated functional outcomes upon discharge from inpatient rehab: modified independent and supervision PT, modified independent and supervision OT, n/a SLP 8. Estimated rehab length of stay to reach the above functional goals is: 7-10 days 9. Anticipated discharge destination: Home 10. Overall Rehab/Functional Prognosis: good   MD Signature:            Revision History                                                 Note Details  Jan Fireman, MD File Time 05/22/2020 12:00 PM  Author Type Physician Status Signed  Last Editor Courtney Heys, MD Service Physical Medicine and [place] # [id number] Admit Date 05/22/2020

## 2020-05-22 NOTE — Progress Notes (Signed)
Inpatient Rehabilitation  Patient information reviewed and entered into eRehab system by Howie Rufus M. Margarie Mcguirt, M.A., CCC/SLP, PPS Coordinator.  Information including medical coding, functional ability and quality indicators will be reviewed and updated through discharge.    

## 2020-05-22 NOTE — Progress Notes (Signed)
Met with the patient and his wife and father to review role of the nurse CM and collaboration with the SW to facilitate discharge process. Reviewed safety and rehab process. Discussed brace and incision care. Continue to follow along to the discharge to address educational needs. Margarito Liner

## 2020-05-22 NOTE — Progress Notes (Signed)
Patient educated on carb mod diet and lovenox shot. Hand outs and diabetic boot given.

## 2020-05-22 NOTE — H&P (Signed)
Physical Medicine and Rehabilitation Admission H&P    Chief Complaint  Patient presents with  . Functional deficits     Bilat L and R quad tendon rupture    HPI:  Arthur Ayers is a 55 year old male with history of HTN, GERD, HNP cervical/lumbar spine; who injured his leg while getting out of his [employer] truck with inability to WB or stand on BLE on 05/13/20. He was found to have left patella fracture with displacement, complete quad tendon tear with large knee joint effusion and hematoma on the left and  and quad tendon avulsion on the left and osseous contusion of posterior medial tibial plateau with partial thickness tear of quad tendon, intrasubstance sprain of lateral collateral ligament nd lateral patellar retinaculum as well as large knee effusion with hematoma and debris. He was taken to OR for repair of bilateral quad tendon by Dr. Eulah Pont on 01/30. Post op WBAT with KI in place and SQ lovenox -->ASA 81 mg bid at discharge for DVT prophylaxis.  Dressing to be left intact for 2 weeks till follow up and to keep knees in full extension.  He has had issues with pain, GERD, acute blood loss anemia, hyponatremia, leucocytosis as well as fasting hyperglycemia. Pain control is improving with tramadol qid and has been able to tolerate activity. Therapy has been working with patient and CIR recommended due to functional decline.    Pain at rest usually 1-2/10 with meds; is 3-4/10 today- doesn't get above 5/10 with therapy/meds. Has only taken scheduled tramadol for last 1-2 days, no prns; LBM 1 hour ago- way too loose- asking to reduce bowel meds.  Voiding OK.  \ Review of Systems  Constitutional: Negative for chills and fever.  HENT: Negative for hearing loss and tinnitus.   Eyes: Negative for blurred vision and double vision.  Respiratory: Negative for cough and shortness of breath.   Cardiovascular: Negative for chest pain, palpitations and leg swelling.  Gastrointestinal: Positive for  diarrhea. Negative for heartburn and nausea.  Genitourinary: Negative for dysuria and urgency.  Musculoskeletal: Positive for joint pain (hip pain ) and myalgias.  Skin: Negative for itching and rash.  Neurological: Positive for weakness and headaches (due to DDD cervical spine).  Psychiatric/Behavioral: The patient has insomnia.   All other systems reviewed and are negative.    Past Medical History:  Diagnosis Date  . Acute pain of right knee 05/13/2020  . GERD (gastroesophageal reflux disease)   . Hypercholesteremia   . Hypertension     Past Surgical History:  Procedure Laterality Date  . QUADRICEPS TENDON REPAIR Bilateral 05/13/2020   Procedure: REPAIR QUADRICEP TENDON;  Surgeon: Sheral Apley, MD;  Location: [facility] OR;  Service: Orthopedics;  Laterality: Bilateral;  . SHOULDER SURGERY     Rotator Cuff Repair in 2015 R shoulder  . TONSILLECTOMY      Family History  Problem Relation Age of Onset  . Diabetes Mother   . Thyroid disease Sister      Social History:  Married. Independent and working PTA. He reports that he has never smoked. He has never used smokeless tobacco. He reports current alcohol use--beer occasionally. Marland Kitchen He reports that he does not use drugs.    Allergies: No Known Allergies    Medications Prior to Admission  Medication Sig Dispense Refill  . acetaminophen (TYLENOL) 500 MG tablet Take 1,000 mg by mouth every 6 (six) hours as needed for mild pain.    . Boswellia-Glucosamine-Vit D (  OSTEO BI-FLEX-GLUCOS/5-LOXIN) TABS Take 2 tablets by mouth at bedtime.    . cetirizine (ZYRTEC) 10 MG tablet Take 10 mg by mouth daily as needed for allergies.    Marland Kitchen co-enzyme Q-10 30 MG capsule Take 30 mg by mouth daily.    Marland Kitchen docusate sodium (COLACE) 100 MG capsule Take 1 capsule (100 mg total) by mouth 2 (two) times daily. 10 capsule 0  . enoxaparin (LOVENOX) 40 MG/0.4ML injection Inject 0.4 mLs (40 mg total) into the skin daily for 20 days. To prevent blood clots after  surgery 8 mL 0  . esomeprazole (NEXIUM) 40 MG capsule Take 40 mg by mouth daily.    . methocarbamol (ROBAXIN) 500 MG tablet Take 1 tablet (500 mg total) by mouth every 6 (six) hours as needed for muscle spasms. 40 tablet 0  . Multiple Vitamin (MULTIVITAMIN) tablet Take 1 tablet by mouth every morning.    . Omega-3 Fatty Acids (FISH OIL) 1000 MG CAPS Take 1,000 mg by mouth daily.    . ondansetron (ZOFRAN) 4 MG tablet Take 1 tablet (4 mg total) by mouth every 6 (six) hours as needed for nausea. 20 tablet 0  . oxyCODONE (ROXICODONE) 5 MG immediate release tablet Take 1 tablet (5 mg total) by mouth every 6 (six) hours as needed for severe pain. 28 tablet 0  . simvastatin (ZOCOR) 20 MG tablet Take 20 mg by mouth daily at 6 PM.    . telmisartan (MICARDIS) 40 MG tablet Take 40 mg by mouth daily.      Drug Regimen Review  Drug regimen was reviewed and remains appropriate with no significant issues identified  Home: Home Living Family/patient expects to be discharged to:: Private residence Living Arrangements: Spouse/significant other,Children   Functional History:    Functional Status:  Mobility:          ADL:    Cognition: Cognition Orientation Level: Oriented X4     Blood pressure 128/69, pulse 85, temperature (!) 97.5 F (36.4 C), temperature source Oral, resp. rate 16, height 5\' 9"  (1.753 m), weight 101.6 kg, SpO2 96 %. Physical Exam Vitals and nursing note reviewed. Exam conducted with a chaperone present.  Constitutional:      Comments: Pt awake, alert, appropriate, sitting up in bed; wife, father, and nursing in room, NAD  HENT:     Head: Normocephalic and atraumatic.     Right Ear: External ear normal.     Left Ear: External ear normal.     Nose: Nose normal. No congestion.     Mouth/Throat:     Mouth: Mucous membranes are moist.     Pharynx: Oropharynx is clear. No oropharyngeal exudate.  Eyes:     General:        Right eye: No discharge.        Left eye: No  discharge.     Extraocular Movements: Extraocular movements intact.  Cardiovascular:     Rate and Rhythm: Normal rate and regular rhythm.     Heart sounds: Normal heart sounds. No murmur heard. No gallop.   Pulmonary:     Comments: CTA B/L- no W/R/R- good air movement  Abdominal:     Comments: Soft, NT, ND, (+)BS  - normoactive  Musculoskeletal:     Cervical back: Normal range of motion and neck supple.     Comments: UEs 5/5 B/L LEs" HF 3-/5 B/L; cannot test KE/KF due to locked Bledsoes braces B/L DF and PF 5/5 B/L Somewhat swollen knees/distal thighs- wrapped in  ACE wraps from thigh to ankles B/L  Skin:    Comments: Skin looks great- a bruise notable on L buttock; otherwise skin intact; IV looks OK in L wrist  Neurological:     Mental Status: He is oriented to person, place, and time.     Comments: Intact to light touch in all 4 extremities B/L  Psychiatric:        Mood and Affect: Mood normal.        Behavior: Behavior normal.     No results found for this or any previous visit (from the past 48 hour(s)). No results found.     Medical Problem List and Plan: 1.  B/L quad tendon repair secondary to tendon ruptures B/L  secondary to a fall at work, with WBAT/no flexion of knees B/L  -patient may not shower, unless they can figure out how to wrap B/L Bledsoes to get into shower- d/w Dr Allena Katz  -ELOS/Goals: 7-10 days mod I to supervision 2.  Antithrombotics: -DVT/anticoagulation:  Pharmaceutical: Lovenox  -antiplatelet therapy: N/A 3. Pain Management:  Continue ultram 50 mg qid scheduled w/Oxycodone prn -->effective- not usually taking prns.  4. Mood: LCSW to follow for evaluation and support.   -antipsychotic agents: N/A 5. Neuropsych: This patient is capable of making decisions on his own behalf. 6. Skin/Wound Care: Dressings to stay in place for 2 weeks post op till follow up with ortho.   --Routine pressure relief measures. Monitor for drainage.   7.  Fluids/Electrolytes/Nutrition: Monitor I/O. Check CMET in am. 8. HTN: Monitor BP TID--continue irbesartan,  9. GERD: Continue Protonix. 10. Leucocytosis: Monitor for fevers and signs of infection. 11. New diagnosis T2DM: has been pre-diabetic. Now with Hgb A1C-7.1.    --Change diet to Carb Modified.  Consult RD for dietary education    --Monitor BS ac/hs and use SSI for elevated BS.  12. ABLA:  Recheck CBC in am. Stabilizing to mid 11,000 13. Hyponatremia: Recheck CMET in am.  14. Hypoalbuminemia:  Protein levels trending down.   --Will add Ensure Max supplements with meals to promote healing.  15. Loose stools -will decrease senokot S to 1 tab nightly from 2 tabs nightly   Jacquelynn Cree- PA-C 05/22/2020   I have personally performed a face to face diagnostic evaluation of this patient and formulated the key components of the plan.  Additionally, I have personally reviewed laboratory data, imaging studies, as well as relevant notes and concur with the physician assistant's documentation above.     Genice Rouge, MD 05/22/2020

## 2020-05-22 NOTE — TOC Progression Note (Addendum)
Transition of Care [facility]) - Progression Note    Patient Details  Name: Arthur Ayers MRN: [id number] Date of Birth: May 24, 1965  [facility] CM/SW Contact  Epifanio Lesches, RN Phone Number: 05/22/2020, 10:01 AM  Clinical Narrative:    NCM received call from  NCM called [facility] [employer]/ Workman's Comp adjuster Ashton Schiff([phone]). Phineas Semen informed NCM approval received for IR. NCM made CIR admission liaison...Marland KitchenCIR liaison to f/u with pt this am. Alcide Evener. Case:Claim # [id number]   TOC team will continue to monitor and assist with needs.   Expected Discharge Plan: IP Rehab Facility Barriers to Discharge: Other (comment)  Expected Discharge Plan and Services Expected Discharge Plan: IP Rehab Facility       Living arrangements for the past 2 months: Single Family Home                                       Social Determinants of Health (SDOH) Interventions    Readmission Risk Interventions No flowsheet data found.

## 2020-05-22 NOTE — Discharge Summary (Signed)
Physician Discharge Summary  Patient ID: Arthur Ayers MRN: [id number] DOB/AGE: [birth year] 55 y.o.  Admit date: 05/12/2020 Discharge date: 05/22/2020  Admission Diagnoses: Bilateral quadriceps tendon ruptures  Discharge Diagnoses:  Principal Problem:   Traumatic rupture of left quadriceps tendon Active Problems:   Acute pain of right knee   Esophageal reflux   Essential hypertension   Fatty liver disease, nonalcoholic   Gastro-esophageal reflux disease with esophagitis, without bleeding   Type II diabetes mellitus (HCC)   Traumatic rupture of right quadriceps tendon   Discharged Condition: fair  Hospital Course: Patient had bilateral quadriceps tendon repairs on 05/13/20 by Dr. Eulah Pont. He was placed in bilateral knee immobilizers and allowed to WBAT. He has been working with PT/OT every day on mobilization and is doing better each day. He was transitioned to bilateral Bledsoe knee braces locked in extension because the knee immobilizers were rubbing on his skin causing discomfort. He likes the knee braces much better. PT/OT recommend CIR and orthopedics agrees that is appropriate. Patient is ready for transfer once worker's comp approves CIR admission.   Consults: None  Significant Diagnostic Studies: CBC, CMP  Treatments: IV hydration, analgesia: acetaminophen, Dilaudid, Morphine and Oxycodone, anticoagulation: Lovenox and surgery: bilateral quadriceps tendon repairs  Discharge Exam: Blood pressure 122/61, pulse 72, temperature (!) 97.5 F (36.4 C), resp. rate 19, height 5\' 9"  (1.753 m), weight 99.8 kg, SpO2 99 %. General appearance: alert, cooperative and no distress Head: Normocephalic, without obvious abnormality, atraumatic Eyes: conjunctivae/corneas clear. PERRL, EOM's intact. Fundi benign. Resp: clear to auscultation bilaterally Cardio: regular rate and rhythm, S1, S2 normal, no murmur, click, rub or gallop GI: soft, non-tender; bowel sounds normal; no masses,  no  organomegaly Extremities: extremities normal, atraumatic, no cyanosis or edema and B/L LE in bledsoe knee braces Pulses: 2+ and symmetric Skin: Skin color, texture, turgor normal. No rashes or lesions Neurologic: Alert and oriented X 3, normal strength and tone. Normal symmetric reflexes. Normal coordination and gait Incision/Wound: c/d/i, covered with dressings  Disposition: Going to CIR      Follow-up Information    , MD. Schedule an appointment as soon as possible for a visit in 1 week(s).   Specialty: Orthopedic Surgery Contact information: [address] [place] [phone]               Signed: [phone] 05/22/2020, 11:44 AM

## 2020-05-23 DIAGNOSIS — E119 Type 2 diabetes mellitus without complications: Secondary | ICD-10-CM

## 2020-05-23 DIAGNOSIS — I10 Essential (primary) hypertension: Secondary | ICD-10-CM

## 2020-05-23 DIAGNOSIS — D72829 Elevated white blood cell count, unspecified: Secondary | ICD-10-CM

## 2020-05-23 DIAGNOSIS — E871 Hypo-osmolality and hyponatremia: Secondary | ICD-10-CM

## 2020-05-23 LAB — CBC WITH DIFFERENTIAL/PLATELET
Abs Immature Granulocytes: 0.59 10*3/uL — ABNORMAL HIGH (ref 0.00–0.07)
Basophils Absolute: 0.1 10*3/uL (ref 0.0–0.1)
Basophils Relative: 1 %
Eosinophils Absolute: 0.3 10*3/uL (ref 0.0–0.5)
Eosinophils Relative: 2 %
HCT: 38.6 % — ABNORMAL LOW (ref 39.0–52.0)
Hemoglobin: 12.7 g/dL — ABNORMAL LOW (ref 13.0–17.0)
Immature Granulocytes: 4 %
Lymphocytes Relative: 15 %
Lymphs Abs: 2.1 10*3/uL (ref 0.7–4.0)
MCH: 29.5 pg (ref 26.0–34.0)
MCHC: 32.9 g/dL (ref 30.0–36.0)
MCV: 89.6 fL (ref 80.0–100.0)
Monocytes Absolute: 1.1 10*3/uL — ABNORMAL HIGH (ref 0.1–1.0)
Monocytes Relative: 8 %
Neutro Abs: 9.8 10*3/uL — ABNORMAL HIGH (ref 1.7–7.7)
Neutrophils Relative %: 70 %
Platelets: 391 10*3/uL (ref 150–400)
RBC: 4.31 MIL/uL (ref 4.22–5.81)
RDW: 12.7 % (ref 11.5–15.5)
WBC: 14 10*3/uL — ABNORMAL HIGH (ref 4.0–10.5)
nRBC: 0 % (ref 0.0–0.2)

## 2020-05-23 LAB — COMPREHENSIVE METABOLIC PANEL
ALT: 45 U/L — ABNORMAL HIGH (ref 0–44)
AST: 34 U/L (ref 15–41)
Albumin: 2.7 g/dL — ABNORMAL LOW (ref 3.5–5.0)
Alkaline Phosphatase: 65 U/L (ref 38–126)
Anion gap: 6 (ref 5–15)
BUN: 12 mg/dL (ref 6–20)
CO2: 26 mmol/L (ref 22–32)
Calcium: 8.8 mg/dL — ABNORMAL LOW (ref 8.9–10.3)
Chloride: 102 mmol/L (ref 98–111)
Creatinine, Ser: 0.87 mg/dL (ref 0.61–1.24)
GFR, Estimated: >60 mL/min (ref >60)
Glucose, Bld: 145 mg/dL — ABNORMAL HIGH (ref 70–99)
Potassium: 4.2 mmol/L (ref 3.5–5.1)
Sodium: 134 mmol/L — ABNORMAL LOW (ref 135–145)
Total Bilirubin: 1 mg/dL (ref 0.3–1.2)
Total Protein: 6.8 g/dL (ref 6.5–8.1)

## 2020-05-23 LAB — GLUCOSE, CAPILLARY
Glucose-Capillary: 140 mg/dL — ABNORMAL HIGH (ref 70–99)
Glucose-Capillary: 228 mg/dL — ABNORMAL HIGH (ref 70–99)
Glucose-Capillary: 126 mg/dL — ABNORMAL HIGH (ref 70–99)
Glucose-Capillary: 113 mg/dL — ABNORMAL HIGH (ref 70–99)
Glucose-Capillary: 139 mg/dL — ABNORMAL HIGH (ref 70–99)

## 2020-05-23 NOTE — Plan of Care (Signed)
Nutrition Education Note  RD consulted for nutrition education regarding diabetes.  Spoke with pt at bedside. 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 For breakfast, pt gets something on the way to work like a biscuit. For lunch, pt gets a fast food meal.  Pt resistant to RD ideas for eating more home-made meals. Brainstormed some options with pt. He states that he may try these ideas.  Lab Results  Component Value Date   HGBA1C 7.1 (H) 05/13/2020    RD provided "Carbohydrate Counting for People with Diabetes" handout from the Academy of Nutrition and Dietetics. Discussed different food groups and their effects on blood sugar, emphasizing carbohydrate-containing foods. Provided list of carbohydrates and recommended serving sizes of common foods.  Discussed importance of controlled and consistent carbohydrate intake throughout the day. Provided examples of ways to balance meals/snacks and encouraged intake of high-fiber, whole grain complex carbohydrates. Teach back method used.  Expect fair compliance.  Current diet order is Carb Modified, patient is consuming approximately 100% of meals at this time. Labs and medications reviewed. No further nutrition interventions warranted at this time. RD contact information provided. If additional nutrition issues arise, please re-consult RD.   Arthur Clause, MS, RD, LDN Inpatient Clinical Dietitian Please see AMiON for contact information.

## 2020-05-23 NOTE — Plan of Care (Signed)
  Problem: RH Balance Goal: LTG: Patient will maintain dynamic sitting balance (OT) Description: LTG:  Patient will maintain dynamic sitting balance with assistance during activities of daily living (OT) Flowsheets (Taken 05/23/2020 1241) LTG: Pt will maintain dynamic sitting balance during ADLs with: Supervision/Verbal cueing Goal: LTG Patient will maintain dynamic standing with ADLs (OT) Description: LTG:  Patient will maintain dynamic standing balance with assist during activities of daily living (OT)  Flowsheets (Taken 05/23/2020 1241) LTG: Pt will maintain dynamic standing balance during ADLs with: Supervision/Verbal cueing   Problem: Sit to Stand Goal: LTG:  Patient will perform sit to stand in prep for activites of daily living with assistance level (OT) Description: LTG:  Patient will perform sit to stand in prep for activites of daily living with assistance level (OT) Flowsheets (Taken 05/23/2020 1241) LTG: PT will perform sit to stand in prep for activites of daily living with assistance level: Supervision/Verbal cueing   Problem: RH Grooming Goal: LTG Patient will perform grooming w/assist,cues/equip (OT) Description: LTG: Patient will perform grooming with assist, with/without cues using equipment (OT) Flowsheets (Taken 05/23/2020 1241) LTG: Pt will perform grooming with assistance level of: Supervision/Verbal cueing   Problem: RH Bathing Goal: LTG Patient will bathe all body parts with assist levels (OT) Description: LTG: Patient will bathe all body parts with assist levels (OT) Flowsheets (Taken 05/23/2020 1241) LTG: Pt will perform bathing with assistance level/cueing: Supervision/Verbal cueing   Problem: RH Dressing Goal: LTG Patient will perform upper body dressing (OT) Description: LTG Patient will perform upper body dressing with assist, with/without cues (OT). Flowsheets (Taken 05/23/2020 1241) LTG: Pt will perform upper body dressing with assistance level of: Independent with  assistive device Goal: LTG Patient will perform lower body dressing w/assist (OT) Description: LTG: Patient will perform lower body dressing with assist, with/without cues in positioning using equipment (OT) Flowsheets (Taken 05/23/2020 1241) LTG: Pt will perform lower body dressing with assistance level of: Supervision/Verbal cueing   Problem: RH Toileting Goal: LTG Patient will perform toileting task (3/3 steps) with assistance level (OT) Description: LTG: Patient will perform toileting task (3/3 steps) with assistance level (OT)  Flowsheets (Taken 05/23/2020 1241) LTG: Pt will perform toileting task (3/3 steps) with assistance level: Supervision/Verbal cueing

## 2020-05-23 NOTE — Progress Notes (Signed)
Occupational Therapy Session Note  Patient Details  Name: Arthur Ayers MRN: [id number] Date of Birth: Jul 15, 1965  Today's Date: 05/23/2020 OT Individual Time: 1450-1531 OT Individual Time Calculation (min): 41 min    Short Term Goals: Week 1:  OT Short Term Goal 1 (Week 1): STG = LTG 2/2 ELOS  Skilled Therapeutic Interventions/Progress Updates:  Pt greeted in the recliner, pain reportedly manageable without additional interventions. Started session with AE training, pt educated on using reacher + sock aide. Pt able to use devices to doff socks and then don new gripper socks with setup after given practice. Transitioned to UB strengthening using the 3# bar x20 reps each exercise. Guided him through UB stretches to balance stretching/strengthening in order to prevent UB injury or strain. Discussed increased reliance on his UB for standing support and functional implications of this. At end of session pt remained sitting up in the recliner, left with all needs within reach and chair alarm set.   Therapy Documentation Precautions:  Precautions Precautions: Fall,Knee Precaution Comments: no knee flexion ROM Required Braces or Orthoses: Knee Immobilizer - Right,Knee Immobilizer - Left Knee Immobilizer - Right: On at all times Knee Immobilizer - Left: On at all times Restrictions Weight Bearing Restrictions: Yes RLE Weight Bearing: Weight bearing as tolerated LLE Weight Bearing: Weight bearing as tolerated Vital Signs: Therapy Vitals Temp: 97.9 F (36.6 C) Pulse Rate: 82 Resp: 16 BP: 130/67 Patient Position (if appropriate): Sitting Oxygen Therapy SpO2: 100 % O2 Device: Room Air Pain: Pain Assessment Pain Scale: 0-10 Pain Score: 0-No pain ADL:     Therapy/Group: Individual Therapy  Margarita Bobrowski A Dhillon [place] 05/23/2020, 3:59 PM

## 2020-05-23 NOTE — Progress Notes (Signed)
Arthur Ayers PHYSICAL MEDICINE & REHABILITATION PROGRESS NOTE  Subjective/Complaints: Patient seen sitting up in bed this morning.  He states he slept well overnight.  He states he is moving his bowels.  He is ready to begin therapies.  ROS: Denies CP, SOB, N/V/D  Objective: Vital Signs: Blood pressure 121/68, pulse 76, temperature 98 F (36.7 C), resp. rate 16, height 5\' 9"  (1.753 m), weight 102 kg, SpO2 97 %. No results found. Recent Labs    05/23/20 0529  WBC 14.0*  HGB 12.7*  HCT 38.6*  PLT 391   Recent Labs    05/23/20 0529  NA 134*  K 4.2  CL 102  CO2 26  GLUCOSE 145*  BUN 12  CREATININE 0.87  CALCIUM 8.8*    Intake/Output Summary (Last 24 hours) at 05/23/2020 1011 Last data filed at 05/23/2020 07/23/2020 Gross per 24 hour  Intake 400 ml  Output 2800 ml  Net -2400 ml        Physical Exam: BP 121/68 (BP Location: Left Arm)   Pulse 76   Temp 98 F (36.7 C)   Resp 16   Ht 5\' 9"  (1.753 m)   Wt 102 kg   SpO2 97%   BMI 33.21 kg/m  Constitutional: No distress . Vital signs reviewed. HENT: Normocephalic.  Atraumatic. Eyes: EOMI. No discharge. Cardiovascular: No JVD.  RRR. Respiratory: Normal effort.  No stridor.  Bilateral clear to auscultation. GI: Non-distended.  BS +. Skin: Warm and dry.   Bilateral lower extremities with dressings CDI Psych: Normal mood.  Normal behavior. Musc:  Bilateral knees with edema and tenderness Neuro: Alert Motor: B/l UE: 5/5 proximal to distal B/l LE: HF: 4/5, Knee limited due bracing, ADF 5/5  Assessment/Plan: 1. Functional deficits which require 3+ hours per day of interdisciplinary therapy in a comprehensive inpatient rehab setting.  Physiatrist is providing close team supervision and 24 hour management of active medical problems listed below.  Physiatrist and rehab team continue to assess barriers to discharge/monitor patient progress toward functional and medical goals   Care Tool:  Bathing               Bathing assist       Upper Body Dressing/Undressing Upper body dressing        Upper body assist      Lower Body Dressing/Undressing Lower body dressing            Lower body assist       Toileting Toileting    Toileting assist Assist for toileting: Independent with assistive device Assistive Device Comment: Urinal   Transfers Chair/bed transfer  Transfers assist           Locomotion Ambulation   Ambulation assist              Walk 10 feet activity   Assist           Walk 50 feet activity   Assist           Walk 150 feet activity   Assist           Walk 10 feet on uneven surface  activity   Assist           Wheelchair     Assist               Wheelchair 50 feet with 2 turns activity    Assist            Wheelchair 150 feet activity  Assist           Medical Problem List and Plan: 1.  B/L quad tendon repair secondary to tendon ruptures B/L  secondary to a fall at work, with WBAT/no flexion of knees B/L  Begin CIR evaluations 2.  Antithrombotics: -DVT/anticoagulation:  Pharmaceutical: Lovenox             -antiplatelet therapy: N/A 3. Pain Management:  Continue ultram 50 mg qid scheduled w/Oxycodone prn -->effective- not usually taking prns.  4. Mood: LCSW to follow for evaluation and support.              -antipsychotic agents: N/A 5. Neuropsych: This patient is capable of making decisions on his own behalf. 6. Skin/Wound Care: Dressings to stay in place for 2 weeks (~4/13) post op till follow up with ortho.              --Routine pressure relief measures. Monitor for drainage.   7. Fluids/Electrolytes/Nutrition: Monitor I/Os 8. HTN: Monitor BP TID--continue irbesartan  Monitor with increased mobility 9. GERD: Continue Protonix. 10. Leucocytosis:   WBCs 14.0 on 4/8, labs ordered for Monday  Afebrile  Monitor for fevers and signs of infection. 11. New diagnosis T2DM: has been  pre-diabetic. Now with Hgb A1C-7.1.    Monitor increase mobility             --Change diet to Carb Modified.  Consult RD for dietary education               --Monitor BS ac/hs and use SSI for elevated BS.  12. ABLA:    Hemoglobin 12.7 on 4/8  Continue to monitor 13. Hyponatremia:   Sodium 134 on 4/8  Continue to monitor 14. Hypoalbuminemia:  Protein levels trending down.              added Ensure Max supplements with meals to promote healing.  15. Loose stools -  Decreased senokot S to 1 tab nightly from 2 tabs nightly  Continue to adjust bowel meds as necessary 15.  Mild transaminitis  ALT mildly elevated on 4/8  LOS: 1 days A FACE TO FACE EVALUATION WAS PERFORMED  Tomaz Janis Karis Juba 05/23/2020, 10:11 AM

## 2020-05-23 NOTE — Plan of Care (Signed)
  Problem: RH Balance Goal: LTG Patient will maintain dynamic sitting balance (PT) Description: LTG:  Patient will maintain dynamic sitting balance with assistance during mobility activities (PT) Flowsheets (Taken 05/23/2020 1639) LTG: Pt will maintain dynamic sitting balance during mobility activities with:: Independent Goal: LTG Patient will maintain dynamic standing balance (PT) Description: LTG:  Patient will maintain dynamic standing balance with assistance during mobility activities (PT) Flowsheets (Taken 05/23/2020 1639) LTG: Pt will maintain dynamic standing balance during mobility activities with:: Supervision/Verbal cueing   Problem: Sit to Stand Goal: LTG:  Patient will perform sit to stand with assistance level (PT) Description: LTG:  Patient will perform sit to stand with assistance level (PT) Flowsheets (Taken 05/23/2020 1639) LTG: PT will perform sit to stand in preparation for functional mobility with assistance level: Supervision/Verbal cueing   Problem: RH Bed Mobility Goal: LTG Patient will perform bed mobility with assist (PT) Description: LTG: Patient will perform bed mobility with assistance, with/without cues (PT). Flowsheets (Taken 05/23/2020 1639) LTG: Pt will perform bed mobility with assistance level of: Supervision/Verbal cueing   Problem: RH Bed to Chair Transfers Goal: LTG Patient will perform bed/chair transfers w/assist (PT) Description: LTG: Patient will perform bed to chair transfers with assistance (PT). Flowsheets (Taken 05/23/2020 1639) LTG: Pt will perform Bed to Chair Transfers with assistance level: Supervision/Verbal cueing   Problem: RH Car Transfers Goal: LTG Patient will perform car transfers with assist (PT) Description: LTG: Patient will perform car transfers with assistance (PT). Flowsheets (Taken 05/23/2020 1639) LTG: Pt will perform car transfers with assist:: Supervision/Verbal cueing   Problem: RH Furniture Transfers Goal: LTG Patient will  perform furniture transfers w/assist (OT/PT) Description: LTG: Patient will perform furniture transfers  with assistance (OT/PT). Flowsheets (Taken 05/23/2020 1639) LTG: Pt will perform furniture transfers with assist:: Supervision/Verbal cueing   Problem: RH Ambulation Goal: LTG Patient will ambulate in controlled environment (PT) Description: LTG: Patient will ambulate in a controlled environment, # of feet with assistance (PT). Flowsheets (Taken 05/23/2020 1639) LTG: Pt will ambulate in controlled environ  assist needed:: Supervision/Verbal cueing LTG: Ambulation distance in controlled environment: 100' w/ LRAD Goal: LTG Patient will ambulate in home environment (PT) Description: LTG: Patient will ambulate in home environment, # of feet with assistance (PT). Flowsheets (Taken 05/23/2020 1639) LTG: Ambulation distance in home environment: 47' w/ LRAD   Problem: RH Wheelchair Mobility Goal: LTG Patient will propel w/c in controlled environment (PT) Description: LTG: Patient will propel wheelchair in controlled environment, # of feet with assist (PT) Flowsheets (Taken 05/23/2020 1639) LTG: Pt will propel w/c in controlled environ  assist needed:: Independent with assistive device LTG: Propel w/c distance in controlled environment: 200'   Problem: RH Stairs Goal: LTG Patient will ambulate up and down stairs w/assist (PT) Description: LTG: Patient will ambulate up and down # of stairs with assistance (PT) Flowsheets (Taken 05/23/2020 1639) LTG: Pt will ambulate up/down stairs assist needed:: Contact Guard/Touching assist LTG: Pt will  ambulate up and down number of stairs: 2 w/ RW for home entry  Sheran Lawless, PT

## 2020-05-23 NOTE — Evaluation (Signed)
Occupational Therapy Assessment and Plan  Patient Details  Name: Arthur Ayers MRN: [id number] Date of Birth: [birth year]  OT Diagnosis: muscle weakness (generalized) and decreased activity tolerance, dynamic standing balance, mobility restrictions impairing ADL/IADL/func mobility performance Rehab Potential: Rehab Potential (ACUTE ONLY): Good ELOS: 7-9 days   Today's Date: 05/23/2020 OT Individual Time: 1101-1156 OT Individual Time Calculation (min): 55 min     Hospital Problem: Principal Problem:   Patellar tendon rupture, unspecified laterality, sequela Active Problems:   CKD (chronic kidney disease) stage 2, GFR 60-89 ml/min   Essential hypertension   Type II diabetes mellitus ([place])   Diabetes mellitus, new onset ([place])   Hyponatremia   Leukocytosis   Past Medical History:  Past Medical History:  Diagnosis Date  . Acute pain of right knee 05/13/2020  . GERD (gastroesophageal reflux disease)   . Hypercholesteremia   . Hypertension    Past Surgical History:  Past Surgical History:  Procedure Laterality Date  . QUADRICEPS TENDON REPAIR Bilateral 05/13/2020   Procedure: REPAIR QUADRICEP TENDON;  Surgeon: Renette Butters, MD;  Location: [facility];  Service: Orthopedics;  Laterality: Bilateral;  . SHOULDER SURGERY     Rotator Cuff Repair in 2015 R shoulder  . TONSILLECTOMY      Assessment & Plan Clinical Impression: Patient is a 55 y.o. year old male with history of HTN, GERD, HNP cervical/lumbar spine; who injured his leg while getting out of his [employer] truck with inability to WB or stand on BLE on 05/13/20. He was found to have left patella fracture with displacement, complete quad tendon tear with large knee joint effusion and hematoma on the left and  and quad tendon avulsion on the left and osseous contusion of posterior medial tibial plateau with partial thickness tear of quad tendon, intrasubstance sprain of lateral collateral ligament nd lateral patellar retinaculum as well  as large knee effusion with hematoma and debris. He was taken to OR for repair of bilateral quad tendon by Dr. Percell Miller on 01/30. Post op WBAT with KI in place and SQ lovenox -->ASA 81 mg bid at discharge for DVT prophylaxis.  Dressing to be left intact for 2 weeks till follow up and to keep knees in full extension.  He has had issues with pain, GERD, acute blood loss anemia, hyponatremia, leucocytosis as well as fasting hyperglycemia. Pain control is improving with tramadol qid and has been able to tolerate activity. Therapy has been working with patient and CIR recommended due to functional decline.   Patient transferred to CIR on 05/22/2020 .    Patient currently requires min with basic self-care skills secondary to muscle weakness, decreased cardiorespiratoy endurance and decreased standing balance, decreased balance strategies and mobility restrictions .  Prior to hospitalization, patient could complete BADL/IADL/func mobility with independent .  Patient will benefit from skilled intervention to increase independence with basic self-care skills prior to discharge home with care partner.  Anticipate patient will require intermittent supervision and follow up home health.  OT - End of Session Activity Tolerance: Tolerates 10 - 20 min activity with multiple rests Endurance Deficit: Yes Endurance Deficit Description: Req increased time and to complete ADL seated OT Assessment Rehab Potential (ACUTE ONLY): Good OT Patient demonstrates impairments in the following area(s): Balance;Endurance;Skin Integrity;Motor OT Basic ADL's Functional Problem(s): Grooming;Bathing;Dressing;Toileting OT Transfers Functional Problem(s): Toilet OT Additional Impairment(s): None OT Plan OT Intensity: Minimum of 1-2 x/day, 45 to 90 minutes OT Frequency: 5 out of 7 days OT Treatment/Interventions: Balance/vestibular training;Community reintegration;Disease mangement/prevention;Neuromuscular re-education;Patient/family  education;Self Care/advanced ADL retraining;Therapeutic Exercise;UE/LE Coordination activities;Wheelchair propulsion/positioning;Discharge planning;DME/adaptive equipment instruction;Functional mobility training;Pain management;Psychosocial support;Skin care/wound managment;Therapeutic Activities;UE/LE Strength taining/ROM OT Self Feeding Anticipated Outcome(s): ind OT Basic Self-Care Anticipated Outcome(s): S OT Toileting Anticipated Outcome(s): S OT Bathroom Transfers Anticipated Outcome(s): S OT Recommendation Patient destination: Home Follow Up Recommendations: Home health OT Equipment Recommended: 3 in 1 bedside comode   OT Evaluation Precautions/Restrictions  Restrictions Weight Bearing Restrictions: Yes RLE Weight Bearing: Weight bearing as tolerated LLE Weight Bearing: Weight bearing as tolerated General Chart Reviewed: Yes Family/Caregiver Present: No Vital Signs Therapy Vitals Temp: 97.9 F (36.6 C) Pulse Rate: 82 Resp: 16 BP: 130/67 Patient Position (if appropriate): Sitting Oxygen Therapy SpO2: 100 % O2 Device: Room Air Pain Pain Assessment Pain Scale: 0-10 Pain Score: 0-No pain Home Living/Prior Functioning Home Living Family/patient expects to be discharged to:: Private residence Living Arrangements: Spouse/significant other,Children Vision Baseline Vision/History: Wears glasses Wears Glasses: At all times Patient Visual Report: No change from baseline Vision Assessment?: No apparent visual deficits Perception  Perception: Within Functional Limits Praxis Praxis: Intact Cognition Overall Cognitive Status: Within Functional Limits for tasks assessed Arousal/Alertness: Awake/alert Orientation Level: Person;Place;Situation Person: Oriented Place: Oriented Situation: Oriented Year: 2022 Month: April Day of Week: Correct Memory: Appears intact Immediate Memory Recall: Sock;Blue;Bed Memory Recall Sock: Without Cue Memory Recall Blue: Without  Cue Memory Recall Bed: Without Cue Attention: Sustained Sustained Attention: Appears intact Awareness: Appears intact Problem Solving: Appears intact Safety/Judgment: Appears intact Comments: Initiated elevating bed before standing-up. Sensation Sensation Light Touch: Appears Intact Hot/Cold: Appears Intact Proprioception: Appears Intact Stereognosis: Appears Intact Coordination Gross Motor Movements are Fluid and Coordinated: No Fine Motor Movements are Fluid and Coordinated: Yes Coordination and Movement Description: Limited by generalized weakness, B KIs, Knee ROM precautions, relies heavily on BUE for func mobility Motor  Motor Motor: Other (comment) Motor - Skilled Clinical Observations: Limited by generalized weakness, B KIs, Knee ROM precautions, relies heavily on BUE for func mobility  Trunk/Postural Assessment  Cervical Assessment Cervical Assessment: Within Functional Limits Thoracic Assessment Thoracic Assessment: Exceptions to [facility] (rounded shoulders) Lumbar Assessment Lumbar Assessment: Within Functional Limits Postural Control Postural Control: Within Functional Limits  Balance Balance Balance Assessed: Yes Static Sitting Balance Static Sitting - Balance Support: Feet unsupported;No upper extremity supported Static Sitting - Level of Assistance: 5: Stand by assistance Dynamic Sitting Balance Dynamic Sitting - Balance Support: Feet supported;During functional activity;No upper extremity supported Dynamic Sitting - Level of Assistance: 5: Stand by assistance Static Standing Balance Static Standing - Balance Support: Bilateral upper extremity supported;During functional activity Static Standing - Level of Assistance: 5: Stand by assistance Dynamic Standing Balance Dynamic Standing - Balance Support: Bilateral upper extremity supported;During functional activity Dynamic Standing - Level of Assistance: 5: Stand by assistance Extremity/Trunk Assessment RUE  Assessment RUE Assessment: Within Functional Limits LUE Assessment LUE Assessment: Within Functional Limits  Care Tool Care Tool Self Care Eating   Eating Assist Level: Independent    Oral Care    Oral Care Assist Level: Contact Guard/Toucning assist (standing)    Bathing   Body parts bathed by patient: Right arm;Left arm;Chest;Abdomen;Front perineal area;Buttocks;Right upper leg;Left upper leg;Face   Body parts n/a: Left lower leg;Right lower leg (covered by brace) Assist Level: Contact Guard/Touching assist    Upper Body Dressing(including orthotics)   What is the patient wearing?: Pull over shirt;Hospital gown only   Assist Level: Set up assist    Lower Body Dressing (excluding footwear)   What is the patient wearing?: Underwear/pull up;Pants Assist  for lower body dressing: Minimal Assistance - Patient > 75%    Putting on/Taking off footwear   What is the patient wearing?: Non-skid slipper socks Assist for footwear: Moderate Assistance - Patient 50 - 74%       Care Tool Toileting Toileting activity   Assist for toileting: Minimal Assistance - Patient > 75%     Care Tool Bed Mobility Roll left and right activity   Roll left and right assist level: Minimal Assistance - Patient > 75%    Sit to lying activity   Sit to lying assist level: Minimal Assistance - Patient > 75%    Lying to sitting edge of bed activity   Lying to sitting edge of bed assist level: Minimal Assistance - Patient > 75%     Care Tool Transfers Sit to stand transfer   Sit to stand assist level: Contact Guard/Touching assist    Chair/bed transfer   Chair/bed transfer assist level: Contact Guard/Touching assist     Toilet transfer   Assist Level: Contact Guard/Touching assist     Care Tool Cognition Expression of Ideas and Wants Expression of Ideas and Wants: Without difficulty (complex and basic) - expresses complex messages without difficulty and with speech that is clear and easy to  understand   Understanding Verbal and Non-Verbal Content Understanding Verbal and Non-Verbal Content: Understands (complex and basic) - clear comprehension without cues or repetitions   Memory/Recall Ability *first 3 days only Memory/Recall Ability *first 3 days only: Current season;Location of own room;Staff names and faces;That he or she is in a hospital/hospital unit    Refer to Care Plan for [place] 1 OT Short Term Goal 1 (Week 1): STG = LTG 2/2 ELOS  Recommendations for other services: Therapeutic Recreation  Pet therapy, Kitchen group, Stress management and Outing/community reintegration   Skilled Therapeutic Intervention ADL ADL Eating: Independent Where Assessed-Eating: Chair;Bed level Grooming: Contact guard Where Assessed-Grooming: Standing at sink Upper Body Bathing: Setup Where Assessed-Upper Body Bathing: Sitting at sink Lower Body Bathing: Contact guard Where Assessed-Lower Body Bathing: Standing at sink;Sitting at sink Upper Body Dressing: Setup Where Assessed-Upper Body Dressing: Sitting at sink Lower Body Dressing: Minimal assistance Where Assessed-Lower Body Dressing: Sitting at sink;Wheelchair Toileting: Minimal assistance Where Assessed-Toileting: Toilet;Bedside Commode Toilet Transfer: Therapist, music Method: Counselling psychologist: Grab bars;Raised toilet seat Tub/Shower Transfer: Not assessed Social research officer, government: Not assessed Mobility  Bed Mobility Bed Mobility: Supine to Sit Supine to Sit: Minimal Assistance - Patient > 75% Transfers Sit to Stand: Contact Guard/Touching assist Stand to Sit: Contact Guard/Touching assist  Session Note: Pt received semi-reclined in bed, agreeable to OT eval. Reviewed role of CIR OT, evaluation process, ADL/func mobility retraining, goals for therapy, and safety plan. Evaluation completed as documented above with session focus on sink side bathing,  dressing, functional mobility, and energy conservation education. Completed supine > sitting EOB with min A to progress BLE off bed and use of bed features. STS from elevated surface with RW, pt relies heavily on BUE support and strength to power up 2/2 B knee immobilizers. Stand-pivot to w/c > recliner with CGA. Total A to don B socks, but pt is able to remove when long-sitting. Additional req A to thread underwear/pants around braces. C/o of minimal pain under B knees when BLE are not supported. Pt amb at room level to sink > toilet > recliner with overall CGA. Reported mod RPE at 6/10 after complete ADL, educated  on energy conservation and importance of seated rest breaks as needed. Pt with no further questions, states his primary goal for CIR stay is to amb without AD.  Pt left seated in recliner with BLE elevated, chair alarm engaged, call bell in reach, and all immediate needs met.    Discharge Criteria: Patient will be discharged from OT if patient refuses treatment 3 consecutive times without medical reason, if treatment goals not met, if there is a change in medical status, if patient makes no progress towards goals or if patient is discharged from hospital.  The above assessment, treatment plan, treatment alternatives and goals were discussed and mutually agreed upon: by patient  Volanda Napoleon MS, OTR/L  05/23/2020, 11:59 AM

## 2020-05-23 NOTE — Progress Notes (Signed)
Patient ID: Arthur Ayers, male   DOB: 1965/07/03, 55 y.o.   MRN: [id number] Met with the patient to review new onset DM management along with hx HTN and HLD with medication and dietary modifications. Patient given additional handouts on DASH diet, cooking with less salt and carbohydrate intake with a protein to aide with metabolism and diabetes survival skills. Reviewed on SSI in the hospital but anticipate will be changed to a oral agent at discharge and on lovenox inj in the hospital but per MD will change to low dose ASA for discharge. Patient receptive to information given and encouragement that recommendations are modifications and tweaking of his usual diet. Continue to follow along to discharge to address educational needs. Margarito Liner

## 2020-05-23 NOTE — Evaluation (Signed)
Physical Therapy Assessment and Plan  Patient Details  Name: Arthur Ayers MRN: [id number] Date of Birth: Jul 11, 1965  PT Diagnosis: Abnormality of gait, Difficulty walking and Pain in joint Rehab Potential: Good ELOS: 5-7 days   Today's Date: 05/23/2020 PT Individual Time: 1300-1400 PT Individual Time Calculation (min): 60 min    Hospital Problem: Principal Problem:   Patellar tendon rupture, unspecified laterality, sequela Active Problems:   CKD (chronic kidney disease) stage 2, GFR 60-89 ml/min   Essential hypertension   Type II diabetes mellitus ([place])   Diabetes mellitus, new onset ([place])   Hyponatremia   Leukocytosis   Past Medical History:  Past Medical History:  Diagnosis Date  . Acute pain of right knee 05/13/2020  . GERD (gastroesophageal reflux disease)   . Hypercholesteremia   . Hypertension    Past Surgical History:  Past Surgical History:  Procedure Laterality Date  . QUADRICEPS TENDON REPAIR Bilateral 05/13/2020   Procedure: REPAIR QUADRICEP TENDON;  Surgeon: Renette Butters, MD;  Location: [facility];  Service: Orthopedics;  Laterality: Bilateral;  . SHOULDER SURGERY     Rotator Cuff Repair in 2015 R shoulder  . TONSILLECTOMY      Assessment & Plan Clinical Impression: Arthur Ayers. Arthur Ayers is a 55 year old male with history of HTN, GERD, HNP cervical/lumbar spine; who injured his leg while getting out of his [employer] truck with inability to WB or stand on BLE on 05/13/20. He was found to have left patella fracture with displacement, complete quad tendon tear with large knee joint effusion and hematoma on the left and  and quad tendon avulsion on the left and osseous contusion of posterior medial tibial plateau with partial thickness tear of quad tendon, intrasubstance sprain of lateral collateral ligament nd lateral patellar retinaculum as well as large knee effusion with hematoma and debris. He was taken to OR for repair of bilateral quad tendon by Dr. Percell Miller on 01/30.  Post op WBAT with KI in place and SQ lovenox -->ASA 81 mg bid at discharge for DVT prophylaxis.  Dressing to be left intact for 2 weeks till follow up and to keep knees in full extension.  He has had issues with pain, GERD, acute blood loss anemia, hyponatremia, leucocytosis as well as fasting hyperglycemia. Pain control is improving with tramadol qid and has been able to tolerate activity. Therapy has been working with patient and CIR recommended due to functional decline.  Patient transferred to CIR on 05/22/2020 .   Patient currently requires min with mobility secondary to muscle weakness, muscle joint tightness and limitation in ROM.  Prior to hospitalization, patient was independent  with mobility and lived with Spouse,Daughter in a House home.  Home access is 2 in garage, 3 in frontStairs to enter.  Patient will benefit from skilled PT intervention to maximize safe functional mobility, minimize fall risk and decrease caregiver burden for planned discharge home with 24 hour supervision.  Anticipate patient will benefit from follow up [place] at discharge.  PT - End of Session Activity Tolerance: Tolerates 30+ min activity with multiple rests Endurance Deficit: Yes Endurance Deficit Description: needs rest breaks due to fatigue PT Assessment Rehab Potential (ACUTE/IP ONLY): Good PT Barriers to Discharge: Home environment access/layout PT Barriers to Discharge Comments: lives in multilevel home, bed and full bath upstairs PT Patient demonstrates impairments in the following area(s): Balance;Endurance;Motor;Safety;Pain PT Transfers Functional Problem(s): Bed Mobility;Bed to Chair;Car PT Locomotion Functional Problem(s): Ambulation;Wheelchair Mobility PT Plan PT Intensity: Minimum of 1-2 x/day ,45  to 90 minutes PT Frequency: 5 out of 7 days PT Duration Estimated Length of Stay: 5-7 days PT Treatment/Interventions: Ambulation/gait training;Discharge planning;DME/adaptive equipment  instruction;Functional mobility training;Pain management;Therapeutic Activities;UE/LE Strength taining/ROM;Wheelchair propulsion/positioning;Therapeutic Exercise;Stair training;Patient/family education;Balance/vestibular training;Neuromuscular re-education PT Transfers Anticipated Outcome(s): Supervision PT Locomotion Anticipated Outcome(s): Supervision with LRAD PT Recommendation Follow Up Recommendations: Home health PT Patient destination: Home Equipment Recommended: 3 in 1 bedside comode;Wheelchair (measurements);Wheelchair cushion (measurements);Rolling walker with 5" wheels Equipment Details: bariatric RW, 20x18 w/c with ELR's   PT Evaluation Precautions/Restrictions Precautions Precautions: Fall;Knee Precaution Comments: no knee flexion ROM Required Braces or Orthoses: Knee Immobilizer - Right;Knee Immobilizer - Left Knee Immobilizer - Right: On at all times Knee Immobilizer - Left: On at all times Restrictions RLE Weight Bearing: Weight bearing as tolerated LLE Weight Bearing: Weight bearing as tolerated Pain Pain Assessment Pain Scale: 0-10 Pain Score: 4  Pain Type: Surgical pain Pain Location: Knee Pain Orientation: Left;Right Pain Descriptors / Indicators: Discomfort;Tender Pain Onset: With Activity Pain Intervention(s): Repositioned Home Living/Prior Functioning Home Living Available Help at Discharge: Family;Available 24 hours/day Type of Home: House Home Access: Stairs to enter [employer] of Steps: 2 in garage, 3 in front Entrance Stairs-Rails: None Home Layout: Multi-level;1/2 bath on main level Alternate Level Stairs-Number of Steps: flight Alternate Level Stairs-Rails: Left Bathroom Shower/Tub: Multimedia programmer: Standard Bathroom Accessibility: Yes Additional Comments: Wife to work from home as long as needed  Lives With: Spouse;Daughter Prior Function Level of Independence: Independent with basic ADLs;Independent with  gait;Independent with homemaking with ambulation;Independent with transfers  Able to Take Stairs?: Yes Driving: Yes Vocation: Full time employment 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 Comments: has had R shoulder surgery in the past Vision/Perception  Perception Perception: Within Functional Limits Praxis Praxis: Intact  Cognition Overall Cognitive Status: Within Functional Limits for tasks assessed Arousal/Alertness: Awake/alert Orientation Level: Oriented X4 Attention: Sustained Sustained Attention: Appears intact Memory: Appears intact Immediate Memory Recall: Sock;Blue;Bed Memory Recall Sock: Without Cue Memory Recall Blue: Without Cue Memory Recall Bed: Without Cue Awareness: Appears intact Problem Solving: Appears intact Safety/Judgment: Appears intact Comments: Initiated elevating bed before standing-up. Sensation Sensation Light Touch: Appears Intact Hot/Cold: Not tested Proprioception: Not tested Stereognosis: Not tested Coordination Gross Motor Movements are Fluid and Coordinated: No Fine Motor Movements are Fluid and Coordinated: Yes Coordination and Movement Description: Limited by generalized weakness, B KIs, Knee ROM precautions, relies heavily on BUE for func mobility Motor  Motor Motor: Other (comment) Motor - Skilled Clinical Observations: Limited by generalized weakness, B KIs, Knee ROM precautions, relies heavily on BUE for func mobility   Trunk/Postural Assessment  Cervical Assessment Cervical Assessment: Within Functional Limits Thoracic Assessment Thoracic Assessment: Exceptions to [facility] (rounded shoulders) Lumbar Assessment Lumbar Assessment: Within Functional Limits Postural Control Postural Control: Within Functional Limits  Balance Balance Balance Assessed: Yes Static Sitting Balance Static Sitting - Balance Support: Feet unsupported;No upper extremity supported Static Sitting - Level of Assistance: 5: Stand by assistance Dynamic Sitting  Balance Dynamic Sitting - Balance Support: Feet supported;During functional activity;No upper extremity supported Dynamic Sitting - Level of Assistance: 5: Stand by assistance Static Standing Balance Static Standing - Balance Support: Bilateral upper extremity supported Static Standing - Level of Assistance: 5: Stand by assistance Dynamic Standing Balance Dynamic Standing - Balance Support: Right upper extremity supported;Left upper extremity supported;During functional activity Dynamic Standing - Level of Assistance: 4: Min assist (CGA) Dynamic Standing - Balance Activities: Forward lean/weight shifting;Reaching for objects Extremity Assessment  RUE Assessment RUE Assessment: Within Functional Limits LUE  Assessment LUE Assessment: Within Functional Limits RLE Assessment RLE Assessment: Exceptions to [facility] Passive Range of Motion (PROM) Comments: hip WFL Active Range of Motion (AROM) Comments: ankle WFL, limited hip flexion due to weakness, knee NT due to immobilizer General Strength Comments: hip flexion 2+/5, knee NT, ankle WFL LLE Assessment LLE Assessment: Exceptions to [facility] Passive Range of Motion (PROM) Comments: Hip WFL Active Range of Motion (AROM) Comments: Ankle WFL, limited hip flexion due to weakness, knee NT due to immobilizer General Strength Comments: hip flexion 2+/5, knee NT, ankle [facility]  Care Tool Care Tool Bed Mobility Roll left and right activity   Roll left and right assist level: Minimal Assistance - Patient > 75%    Sit to lying activity   Sit to lying assist level: Moderate Assistance - Patient 50 - 74%    Lying to sitting edge of bed activity   Lying to sitting edge of bed assist level: Minimal Assistance - Patient > 75%     Care Tool Transfers Sit to stand transfer   Sit to stand assist level: Contact Guard/Touching assist    Chair/bed transfer   Chair/bed transfer assist level: Contact Guard/Touching assist     Toilet transfer        Car transfer    Car transfer assist level: Moderate Assistance - Patient 50 - 74%      Care Tool Locomotion Ambulation   Assist level: Contact Guard/Touching assist Assistive device: Walker-rolling Max distance: 80'  Walk 10 feet activity   Assist level: Contact Guard/Touching assist Assistive device: Walker-rolling   Walk 50 feet with 2 turns activity   Assist level: Contact Guard/Touching assist Assistive device: Walker-rolling  Walk 150 feet activity Walk 150 feet activity did not occur: Safety/medical concerns      Walk 10 feet on uneven surfaces activity   Assist level: Contact Guard/Touching assist Assistive device: Walker-rolling  Stairs Stair activity did not occur: Safety/medical concerns        Walk up/down 1 step activity Walk up/down 1 step or curb (drop down) activity did not occur: Safety/medical concerns     Walk up/down 4 steps activity did not occuR: Safety/medical concerns  Walk up/down 4 steps activity      Walk up/down 12 steps activity Walk up/down 12 steps activity did not occur: Safety/medical concerns      Pick up small objects from floor Pick up small object from the floor (from standing position) activity did not occur: Safety/medical concerns      Wheelchair Will patient use wheelchair at discharge?: Yes Type of Wheelchair: Manual   Wheelchair assist level: Supervision/Verbal cueing Max wheelchair distance: 150'  Wheel 50 feet with 2 turns activity   Assist Level: Supervision/Verbal cueing  Wheel 150 feet activity   Assist Level: Supervision/Verbal cueing    Refer to Care Plan for Long Term Goals  SHORT TERM GOAL WEEK 1 PT Short Term Goal 1 (Week 1): STG=LTG due to ELOS  Recommendations for other services: Therapeutic Recreation  Outing/community reintegration  Skilled Therapeutic Intervention Patient in recliner in room and agreeable to PT.  Discussed prior function and home set up.  Patient sit to stand CGA from recliner using UE assist from  armrests to walker.  Did obtain bariatric RW as he reports standard size RW does not fit his legs and braces and rubs.  Patient ambulated with CGA to S x 42' with RW and w/c close follow.  Patient propelled w/c x 150' + with S and obtained longer  legrests for fitting legs with foot plates.  Patient performed standing balance activity at BITS tapping targets over screen with one UE supported on RW and CGA standing for 2 minutes.  Demonstrated option for accessing vehicle to back seat and sliding across.  Patient performed with mod A for lifting LE's to simulated [employer].  Patient propelled to room with S.  Stand step to recliner with CGA.  Obtained home measurement sheet for wife to measure height of bed, vehicle, step to shower, doorways, etc.  Patient left with legs elevated and call bell/needs in reach and chair alarm set.  Mobility Bed Mobility Bed Mobility: Supine to Sit Supine to Sit: Minimal Assistance - Patient > 75% Transfers Transfers: Sit to Stand;Stand to Sit;Stand Pivot Transfers Sit to Stand: Contact Guard/Touching assist Stand to Sit: Contact Guard/Touching assist Stand Pivot Transfers: Contact Guard/Touching assist Transfer (Assistive device): Rolling walker Locomotion  Gait Ambulation: Yes Gait Assistance: Contact Guard/Touching assist Gait Distance (Feet): 80 Feet Assistive device: Rolling walker Gait Gait: Yes Gait Pattern: Impaired Gait Pattern: Step-to pattern;Decreased stride length;Left hip hike;Right hip hike;Abducted - left;Abducted- right;Wide base of support Stairs / Additional Locomotion Stairs: No Architect: Yes Wheelchair Assistance: Chartered loss adjuster: Both upper extremities Wheelchair Parts Management: Needs assistance Distance: 150'   Discharge Criteria: Patient will be discharged from PT if patient refuses treatment 3 consecutive times without medical reason, if treatment goals not  met, if there is a change in medical status, if patient makes no progress towards goals or if patient is discharged from hospital.  The above assessment, treatment plan, treatment alternatives and goals were discussed and mutually agreed upon: by patient  Jamison Oka, PT 05/23/2020, 4:27 PM

## 2020-05-23 NOTE — Progress Notes (Signed)
Inpatient Rehabilitation Center Individual Statement of Services  Patient Name:  BLAKE VETRANO  Date:  05/23/2020  Welcome to the Inpatient Rehabilitation Center.  Our goal is to provide you with an individualized program based on your diagnosis and situation, designed to meet your specific needs.  With this comprehensive rehabilitation program, you will be expected to participate in at least 3 hours of rehabilitation therapies Monday-Friday, with modified therapy programming on the weekends.  Your rehabilitation program will include the following services:  Physical Therapy (PT), Occupational Therapy (OT), 24 hour per day rehabilitation nursing, Therapeutic Recreaction (TR), Care Coordinator, Rehabilitation Medicine, Nutrition Services and Pharmacy Services  Weekly team conferences will be held on Wednesday to discuss your progress.  Your Inpatient Rehabilitation Care Coordinator will talk with you frequently to get your input and to update you on team discussions.  Team conferences with you and your family in attendance may also be held.  Expected length of stay: 7 days  Overall anticipated outcome: supervision with cueing  Depending on your progress and recovery, your program may change. Your Inpatient Rehabilitation Care Coordinator will coordinate services and will keep you informed of any changes. Your Inpatient Rehabilitation Care Coordinator's name and contact numbers are listed  below.  The following services may also be recommended but are not provided by the Inpatient Rehabilitation Center:   Driving Evaluations  Home Health Rehabiltiation Services  Outpatient Rehabilitation Services  Vocational Rehabilitation   Arrangements will be made to provide these services after discharge if needed.  Arrangements include referral to agencies that provide these services.  Your insurance has been verified to be:  Workers Comp & BCBS Your primary doctor is:  Ninetta Lights  Pertinent  information will be shared with your doctor and your insurance company.  Inpatient Rehabilitation Care Coordinator:  Dossie Der, [place] [phone] or Luna Glasgow  Information discussed with and copy given to patient by: Lucy Chris, 05/23/2020, 11:08 AM

## 2020-05-23 NOTE — Progress Notes (Signed)
Inpatient Rehabilitation Care Coordinator Assessment and Plan Patient Details  Name: Arthur Ayers MRN: [id number] Date of Birth: [birth year]  Today's Date: 05/23/2020  Hospital Problems: Principal Problem:   Patellar tendon rupture, unspecified laterality, sequela Active Problems:   CKD (chronic kidney disease) stage 2, GFR 60-89 ml/min   Essential hypertension   Type II diabetes mellitus ([facility])   Diabetes mellitus, new onset ([facility])   Hyponatremia   Leukocytosis  Past Medical History:  Past Medical History:  Diagnosis Date  . Acute pain of right knee 05/13/2020  . GERD (gastroesophageal reflux disease)   . Hypercholesteremia   . Hypertension    Past Surgical History:  Past Surgical History:  Procedure Laterality Date  . QUADRICEPS TENDON REPAIR Bilateral 05/13/2020   Procedure: REPAIR QUADRICEP TENDON;  Surgeon: Sheral Apley, MD;  Location: [facility] OR;  Service: Orthopedics;  Laterality: Bilateral;  . SHOULDER SURGERY     Rotator Cuff Repair in 2015 R shoulder  . TONSILLECTOMY     Social History:  reports that he has never smoked. He has never used smokeless tobacco. He reports current alcohol use. He reports that he does not use drugs.  Family / Support Systems Marital Status: Married Patient Roles: Spouse,Parent,Other (Comment) (employee) Spouse/Significant Other: Sherri [phone] Children: Two daughter's still at home in college/working Other Supports: nieghbors and extended family Anticipated Caregiver: Sherri Ability/Limitations of Caregiver: Sherri plans to work from home while pt will need her and then return to work when pt recovered Caregiver Availability: 24/7 Family Dynamics: Close knit with family and extended family. Has good social supports via family, friends, neighbors  Social History Preferred language: English Religion: [employer] Cultural Background: No issues Education: HS Read: Yes Write: Yes Employment Status: Employed Name of Employer:  [employer] Return to Work Plans: Plans to return once recovered from this Legal History/Current Legal Issues: None-pt is a wokers comp case due to fall happen at work Guardian/Conservator: None-according to MD pt is capable of making his own decisions while here   Abuse/Neglect Abuse/Neglect Assessment Can Be Completed: Yes Physical Abuse: Denies Verbal Abuse: Denies Sexual Abuse: Denies Exploitation of patient/patient's resources: Denies Self-Neglect: Denies  Emotional Status Pt's affect, behavior and adjustment status: Pt is motivated to do what he can to make himself as little assist as possible, but realizes it will take time to heal and recover from this. He needs to be patient and give it time to heal. Recent Psychosocial Issues: thought he was healthy prior to admission Psychiatric History: No issues deferred depression screen due to coping appropriately and feels it will take time to get through this Substance Abuse History: No issues  Patient / Family Perceptions, Expectations & Goals Pt/Family understanding of illness & functional limitations: Pt and wife can explain his injuries and surgeries. Both talk with the MD and feel their questions have been addressed and feel aware of her treatment plan going forward. Premorbid pt/family roles/activities: husband, father, friend, co-worker, neighbor, etc Anticipated changes in roles/activities/participation: resume Pt/family expectations/goals: Pt states: " I hope to do well here and be able to do as much as I can for myself."  Wife states: " I hope he doesn;t need a lot of physical care when he goes home from here.:  [employer]: None Premorbid Home Care/DME Agencies: None Transportation available at discharge: Pt and wife  Discharge Planning Living Arrangements: Spouse/significant other,Children Support Systems: Spouse/significant other,Children,Other relatives,Friends/neighbors Type of Residence: Private  residence Civil engineer, contracting: Media planner (specify) (Workers Occupational hygienist and Acupuncturist) Surveyor, quantity  Resources: Science writer Screen Referred: No Living Expenses: Mortgage Money Management: Patient,Spouse Does the patient have any problems obtaining your medications?: No Home Management: Wife Patient/Family Preliminary Plans: Return home with wife who will be working from home to assist him until he is recovered and can don on his own. Their two daughter's are supportive but in shcool and working. Will await team's evaluations and work with Workers Comp CM on needed equipment and follow up Care Coordinator Anticipated Follow Up Needs: HH/OP  Clinical Impression Pleasant gentleman unfortunately fell and ruptured his tendons. His family is involved and supportive. Will work with Workers Comp-CM on discharge needs.  Lucy Chris 05/23/2020, 11:06 AM

## 2020-05-23 NOTE — Progress Notes (Signed)
Patient ID: Arthur Ayers, male   DOB: Oct 22, 1965, 55 y.o.   MRN: [id number] PT feels pt will be a short length of stay. Pt does need a hospital bed and portable temporary ramp for home. Have contacted CM Phineas Semen who reports will assign RN-CM to and they will deal with discharge needs. Have faxed prescriptions for ramp and hospital bed so will have to begin obtaining. Hopefully this will not take too long to obtain.

## 2020-05-24 LAB — GLUCOSE, CAPILLARY
Glucose-Capillary: 118 mg/dL — ABNORMAL HIGH (ref 70–99)
Glucose-Capillary: 109 mg/dL — ABNORMAL HIGH (ref 70–99)
Glucose-Capillary: 165 mg/dL — ABNORMAL HIGH (ref 70–99)
Glucose-Capillary: 124 mg/dL — ABNORMAL HIGH (ref 70–99)

## 2020-05-24 NOTE — Progress Notes (Signed)
[place] PHYSICAL MEDICINE & REHABILITATION PROGRESS NOTE  Subjective/Complaints:   No pain overnite, some pain with standing   ROS: Denies CP, SOB, N/V/D  Objective: Vital Signs: Blood pressure 119/65, pulse 69, temperature 98.2 F (36.8 C), temperature source Oral, resp. rate 19, height 5\' 9"  (1.753 m), weight 100.6 kg, SpO2 98 %. No results found. Recent Labs    05/23/20 0529  WBC 14.0*  HGB 12.7*  HCT 38.6*  PLT 391   Recent Labs    05/23/20 0529  NA 134*  K 4.2  CL 102  CO2 26  GLUCOSE 145*  BUN 12  CREATININE 0.87  CALCIUM 8.8*    Intake/Output Summary (Last 24 hours) at 05/24/2020 0755 Last data filed at 05/24/2020 0720 Gross per 24 hour  Intake 742 ml  Output 3675 ml  Net -2933 ml        Physical Exam: BP 119/65 (BP Location: Left Arm)   Pulse 69   Temp 98.2 F (36.8 C) (Oral)   Resp 19   Ht 5\' 9"  (1.753 m)   Wt 100.6 kg   SpO2 98%   BMI 32.75 kg/m   General: No acute distress Mood and affect are appropriate Heart: Regular rate and rhythm no rubs murmurs or extra sounds Lungs: Clear to auscultation, breathing unlabored, no rales or wheezes Abdomen: Positive bowel sounds, soft nontender to palpation, nondistended Extremities: No clubbing, cyanosis, or edema Skin: No evidence of breakdown, no evidence of rash  Neuro: Alert Motor: B/l UE: 5/5 proximal to distal B/l LE: HF: 4/5, Knee limited due bracing, ADF 5/5  Assessment/Plan: 1. Functional deficits which require 3+ hours per day of interdisciplinary therapy in a comprehensive inpatient rehab setting.  Physiatrist is providing close team supervision and 24 hour management of active medical problems listed below.  Physiatrist and rehab team continue to assess barriers to discharge/monitor patient progress toward functional and medical goals   Care Tool:  Bathing    Body parts bathed by patient: Right arm,Left arm,Chest,Abdomen,Front perineal area,Buttocks,Right upper leg,Left upper  leg,Face     Body parts n/a: Left lower leg,Right lower leg (covered by brace)   Bathing assist Assist Level: Contact Guard/Touching assist     Upper Body Dressing/Undressing Upper body dressing   What is the patient wearing?: Pull over shirt,Hospital gown only    Upper body assist Assist Level: Set up assist    Lower Body Dressing/Undressing Lower body dressing      What is the patient wearing?: Pants,Underwear/pull up     Lower body assist Assist for lower body dressing: Minimal Assistance - Patient > 75%     Toileting Toileting    Toileting assist Assist for toileting: Minimal Assistance - Patient > 75% Assistive Device Comment: Walker   Transfers Chair/bed transfer  Transfers assist     Chair/bed transfer assist level: Contact Guard/Touching assist     Locomotion Ambulation   Ambulation assist      Assist level: Contact Guard/Touching assist Assistive device: Walker-rolling Max distance: 80'   Walk 10 feet activity   Assist     Assist level: Contact Guard/Touching assist Assistive device: Walker-rolling   Walk 50 feet activity   Assist    Assist level: Contact Guard/Touching assist Assistive device: Walker-rolling    Walk 150 feet activity   Assist Walk 150 feet activity did not occur: Safety/medical concerns         Walk 10 feet on uneven surface  activity   Assist  Assist level: Contact Guard/Touching assist Assistive device: Photographer Will patient use wheelchair at discharge?: Yes Type of Wheelchair: Manual    Wheelchair assist level: Supervision/Verbal cueing Max wheelchair distance: 150'    Wheelchair 50 feet with 2 turns activity    Assist        Assist Level: Supervision/Verbal cueing   Wheelchair 150 feet activity     Assist      Assist Level: Supervision/Verbal cueing    Medical Problem List and Plan: 1.  B/L quad tendon repair secondary to tendon  ruptures B/L  secondary to a fall at work, with WBAT/no flexion of knees B/L  CIR PT, OT  2.  Antithrombotics: -DVT/anticoagulation:  Pharmaceutical: Lovenox             -antiplatelet therapy: N/A 3. Pain Management:  Continue ultram 50 mg qid scheduled w/Oxycodone prn -->effective- not usually taking prns.  4. Mood: LCSW to follow for evaluation and support.              -antipsychotic agents: N/A 5. Neuropsych: This patient is capable of making decisions on his own behalf. 6. Skin/Wound Care: Dressings to stay in place for 2 weeks (~4/13) post op till follow up with ortho.              --Routine pressure relief measures. Monitor for drainage.   7. Fluids/Electrolytes/Nutrition: Monitor I/Os 8. HTN: Monitor BP TID--continue irbesartan  Monitor with increased mobility 9. GERD: Continue Protonix. 10. Leucocytosis:   WBCs 14.0 on 4/8,similar to previous value  labs ordered for Monday  Afebrile  Monitor for fevers and signs of infection. 11. New diagnosis T2DM: has been pre-diabetic. Now with Hgb A1C-7.1.     CBG (last 3)  Recent Labs    05/23/20 1646 05/23/20 2104 05/24/20 0556  GLUCAP 113* 139* 118*  controlled 4/9  12. ABLA:    Hemoglobin 12.7 on 4/8  Continue to monitor 13. Hyponatremia:   Sodium 134 on 4/8  Continue to monitor 14. Hypoalbuminemia:  Protein levels trending down.              added Ensure Max supplements with meals to promote healing.  15. Loose stools -  Decreased senokot S to 1 tab nightly from 2 tabs nightly  Now having BM qod  15.  Mild transaminitis  ALT mildly elevated on 4/8  LOS: 2 days A FACE TO FACE EVALUATION WAS PERFORMED  Erick Colace 05/24/2020, 7:55 AM

## 2020-05-25 LAB — GLUCOSE, CAPILLARY
Glucose-Capillary: 110 mg/dL — ABNORMAL HIGH (ref 70–99)
Glucose-Capillary: 111 mg/dL — ABNORMAL HIGH (ref 70–99)
Glucose-Capillary: 101 mg/dL — ABNORMAL HIGH (ref 70–99)
Glucose-Capillary: 137 mg/dL — ABNORMAL HIGH (ref 70–99)

## 2020-05-25 NOTE — Progress Notes (Signed)
Occupational Therapy Session Note  Patient Details  Name: Arthur Ayers MRN: [id number] Date of Birth: 26-Jan-1966  Today's Date: 05/25/2020 OT Individual Time: 1100-1205   &   1300-1345 OT Individual Time Calculation (min): 65 min    &   45 min   Short Term Goals: Week 1:  OT Short Term Goal 1 (Week 1): STG = LTG 2/2 ELOS  Skilled Therapeutic Interventions/Progress Updates:    AM session:   Patient seated in recliner, states that pain is under control.  Sit to stand and SPT with RW to/from recliner and w/c CS.  Reviewed options for shower seats and potential methods for transferring when he is cleared to shower - practiced stepping over threshold and sit to stand from transfer bench - he is able to complete both with CS/CGA.  Reviewed set up and use of standard commode.  SPT to/from commode CS.  He demonstrates good understanding of DME set up and appropriate use.  Completed UB ergometer x 6 minutes at level 4.  He ambulates on unit from therapy gym back to room with CS and RW.  He returned to recliner at close of session with CS.  Call bell and tray table in reach.     PM session:   Patient seated in recliner, notes that pain is still under control ("3" at most with mobility).  Sit to stand and ambulation with RW to/from recliner, arm chair CS.  Completed upper body bathing/dressing seated with set up, CS for grooming tasks in stance.  He declined lower body adl need at this time.  Completed UB theraband exercises - noted fatigue and spasm in triceps - stopped exercise, reviewed light stretches and balance of rest/activity.   Completed ambulation with RW CS level.  Returned to recliner at close of session, provided ice pack for triceps, call bell and tray table in reach.       Therapy Documentation Precautions:  Precautions Precautions: Fall,Knee Precaution Comments: no knee flexion ROM Required Braces or Orthoses: Knee Immobilizer - Right,Knee Immobilizer - Left Knee Immobilizer -  Right: On at all times Knee Immobilizer - Left: On at all times Restrictions Weight Bearing Restrictions: Yes RLE Weight Bearing: Weight bearing as tolerated LLE Weight Bearing: Weight bearing as tolerated   Therapy/Group: Individual Therapy  [name] 05/25/2020, 7:30 AM

## 2020-05-25 NOTE — Progress Notes (Signed)
Physical Therapy Session Note  Patient Details  Name: Arthur Ayers MRN: [id number] Date of Birth: 21-Dec-1965  Today's Date: 05/25/2020 PT Individual Time: 0800-0900; 1500-1530 PT Individual Time Calculation (min): 60 min and 30 min  Short Term Goals: Week 1:  PT Short Term Goal 1 (Week 1): STG=LTG due to ELOS  Skilled Therapeutic Interventions/Progress Updates:    Session 1: Pt received seated in bed, agreeable to PT session. Pt reports no pain in legs at rest, does have 5/10 LBP. Pt reports being premedicated prior to start of therapy session and reports improvement in back pain with standing activity throughout session. Bed mobility Supervision with increased time needed for BLE management. Sit to stand with CGA to RW throughout session. Ambulation to sink with RW and Supervision. Pt able to perform oral hygiene while standing at sink with RW and Supervision. Ambulation x 80 ft with RW and Supervision with heavy reliance on BUE due to WBAT and Bledsoe braces in place on BLE. Ascend/descend 8 x 3" stairs with 2 handrails and CGA for balance, step-to gait pattern. Ascend/descend 4 x 6" stairs with 2 handrails and min A for balance, step-to gait pattern. Pt exhibits compensatory gait pattern with hip hiking and circumduction due to inability to bend knees due to restrictions and Bledsoe braces. Per pt report he does not have railings on STE his home. Ascend/descend 2 x 5 reps of 3" steps backwards with RW, CGA for balance. Focus on navigation of bottom step for LE strengthening and safe RW management. Sit to stand 3 x 5 reps from progressively lower mat table with RW and CGA for balance, RW stabilized due to pt pushing up with BUE from RW. Pt demos good management of BLE with transfer and improved ability to transfer from lower surfaces. Manual w/c propulsion x 150 ft with use of BUE at Supervision level. Pt requires assist for BLE ELR management. Stand pivot transfer to recliner from w/c with RW and  CGA. Pt left seated in recliner in room with needs in reach at end of session.  Session 2: Pt received seated in recliner in room, agreeable to PT session. Pt reports 2/10 pain in BLE, declines intervention. Intermittent tricep pain L>R throughout session, utilized ice pack to L tricep at end of session for pain management. Sit to stand with CGA and RW throughout session. Stand pivot transfer recliner to/from w/c with RW and CGA. Ascend/descend ramp with RW and CGA. Ambulation across uneven surface with RW and CGA. Pt reports having an inclined grassy hill at home he wants to work towards being able to navigate as safe and able in order to reach his outdoor patio area. Ascend/descend 5 x 3" stairs backwards x 2 reps with RW and CGA for balance for LE strengthening to work towards stair configuration in home environment. Pt left seated in recliner in room with needs in reach at end of session.  Therapy Documentation Precautions:  Precautions Precautions: Fall,Knee Precaution Comments: no knee flexion ROM Required Braces or Orthoses: Knee Immobilizer - Right,Knee Immobilizer - Left Knee Immobilizer - Right: On at all times Knee Immobilizer - Left: On at all times Restrictions Weight Bearing Restrictions: Yes RLE Weight Bearing: Weight bearing as tolerated LLE Weight Bearing: Weight bearing as tolerated   Therapy/Group: Individual Therapy   Peter Congo, PT, DPT, CSRS  05/25/2020, 10:51 AM

## 2020-05-26 LAB — BASIC METABOLIC PANEL
Anion gap: 7 (ref 5–15)
BUN: 17 mg/dL (ref 6–20)
CO2: 28 mmol/L (ref 22–32)
Calcium: 9 mg/dL (ref 8.9–10.3)
Chloride: 97 mmol/L — ABNORMAL LOW (ref 98–111)
Creatinine, Ser: 0.95 mg/dL (ref 0.61–1.24)
GFR, Estimated: >60 mL/min (ref >60)
Glucose, Bld: 123 mg/dL — ABNORMAL HIGH (ref 70–99)
Potassium: 4.1 mmol/L (ref 3.5–5.1)
Sodium: 132 mmol/L — ABNORMAL LOW (ref 135–145)

## 2020-05-26 LAB — CBC
HCT: 38.2 % — ABNORMAL LOW (ref 39.0–52.0)
Hemoglobin: 12.8 g/dL — ABNORMAL LOW (ref 13.0–17.0)
MCH: 29.9 pg (ref 26.0–34.0)
MCHC: 33.5 g/dL (ref 30.0–36.0)
MCV: 89.3 fL (ref 80.0–100.0)
Platelets: 454 10*3/uL — ABNORMAL HIGH (ref 150–400)
RBC: 4.28 MIL/uL (ref 4.22–5.81)
RDW: 12.8 % (ref 11.5–15.5)
WBC: 12.9 10*3/uL — ABNORMAL HIGH (ref 4.0–10.5)
nRBC: 0 % (ref 0.0–0.2)

## 2020-05-26 LAB — GLUCOSE, CAPILLARY
Glucose-Capillary: 116 mg/dL — ABNORMAL HIGH (ref 70–99)
Glucose-Capillary: 124 mg/dL — ABNORMAL HIGH (ref 70–99)
Glucose-Capillary: 119 mg/dL — ABNORMAL HIGH (ref 70–99)
Glucose-Capillary: 129 mg/dL — ABNORMAL HIGH (ref 70–99)

## 2020-05-26 MED ORDER — OXYCODONE HCL 5 MG PO TABS: 10.0000 mg | ORAL_TABLET | ORAL | Status: DC | PRN

## 2020-05-26 NOTE — Progress Notes (Signed)
Occupational Therapy Session Note  Patient Details  Name: Arthur Ayers MRN: [id number] Date of Birth: 09-27-1965  Today's Date: 05/26/2020 OT Individual Time: 1300-1400   &   1445-1530 OT Individual Time Calculation (min): 60 min    &    45 min   Short Term Goals: Week 1:  OT Short Term Goal 1 (Week 1): STG = LTG 2/2 ELOS  Skilled Therapeutic Interventions/Progress Updates:    Session 1:   Patient seated in recliner, denies pain.  Sit to stand and ambulation with RW to/from therapy gym with CS.  Reviewed and practiced reach and transport strategies in kitchen environment - reviewed energy conservation and conditioning activities - he demonstrates good understanding and safety.  He is able to ambulate and retrieve objects t/o kitchen.  Reviewed and practiced use of reacher and shoe funnel.  Provided elastic laces for shoes.  Overall good carryover - due to braces locked in extension he may continue to require assistance to donn shoes safely.  He returned to recliner at close of session.   Call bell and tray table in reach.    Session 2:   Patient seated in recliner - ready for 2nd session.  SPT with RW to/from recliner, w/c, toilet CS.  toileting completed with CS/DS.  Able to propel w/c on level surfaces.  Completed upper body ergometer 2 x 5 minutes.  Reviewed DME, assistive devices, plan for discharge, next steps regarding therapy and progression.  He returned to recliner at close of session, call bell and tray table in reach, ice pack provided.       Therapy Documentation Precautions:  Precautions Precautions: Fall,Knee Precaution Comments: no knee flexion ROM Required Braces or Orthoses: Knee Immobilizer - Right,Knee Immobilizer - Left Knee Immobilizer - Right: On at all times Knee Immobilizer - Left: On at all times Restrictions Weight Bearing Restrictions: Yes RLE Weight Bearing: Weight bearing as tolerated LLE Weight Bearing: Weight bearing as tolerated  Therapy/Group:  Individual Therapy  [name] 05/26/2020, 7:43 AM

## 2020-05-26 NOTE — Progress Notes (Signed)
Physical Therapy Session Note  Patient Details  Name: Arthur Ayers MRN: [id number] Date of Birth: 1966-01-20  Today's Date: 05/26/2020 PT Individual Time: 1600-1700 PT Individual Time Calculation (min): 60 min   Short Term Goals: Week 1:  PT Short Term Goal 1 (Week 1): STG=LTG due to ELOS  Skilled Therapeutic Interventions/Progress Updates:     Patient in recliner and relates therapy sessions from earlier today.  States had legs rewrapped and braces off then on.  Patient sit to stand with S to RW.  Ambulated to ortho gym x 150' with RW and S (w/c follow for safety).  Patient sit to supine on mat with S and performed therex as noted below.  Supine to sit with S and standing therex as noted below.  Patient performed step up onto 4" step after demonstration with RW.  Negotiated between tight space with side stepping with RW with cues and CGA.  Patient in w/c to general gym with S.  Negotiated 4 steps with rails and min A especially for descent.  Discussed if can get rails on steps instead of ramp might be less cumbersome.  Demonstrated 4 steps with axillary crutches using swing around technique, but discussed it is risky and likely not acceptable by worker's comp.  Patient propelled in w/c to room,  Stand step to recliner with RW and S.  Left with legs elevated, needs in reach and ice to R knee.   Therapy Documentation Precautions:  Precautions Precautions: Fall,Knee Precaution Comments: no knee flexion ROM Required Braces or Orthoses: Knee Immobilizer - Right,Knee Immobilizer - Left Knee Immobilizer - Right: On at all times Knee Immobilizer - Left: On at all times Restrictions Weight Bearing Restrictions: No RLE Weight Bearing: Weight bearing as tolerated LLE Weight Bearing: Weight bearing as tolerated Pain: Pain Assessment Pain Scale: 0-10 Pain Score: 3  Pain Type: Acute pain Pain Location: Knee Pain Orientation: Right;Left Pain Descriptors / Indicators: Aching Pain Onset: With  Activity Pain Intervention(s): Repositioned;Rest   Exercises: General Exercises - Lower Extremity Ankle Circles/Pumps: AROM;20 reps;Supine;Both Hip ABduction/ADduction: Both;10 reps;Supine;Strengthening Straight Leg Raises: Both;Strengthening;AROM;10 reps;Supine Total Joint Exercises Bridges: Strengthening;Both;Supine (legs extended on wedge w/ 5 sec hold) Other Exercises Other Exercises: isometric hip adduction with ball between legs 10 reps 5 sec hold Other Exercises: standing heel cord stretch on wedge 3 x 30 sec hold Other Exercises: standing heel raises x 10   Therapy/Group: Individual Therapy  Elray Mcgregor  Sheran Lawless, PT 05/26/2020, 4:39 PM

## 2020-05-26 NOTE — Progress Notes (Addendum)
Patient ID: Arthur Ayers, male   DOB: [birth year], 55 y.o.   MRN: [id number] Spoke with [employer] CM to ask if got fax sent Friday. She reported has gone through her faxes today from Friday. Informed her MD and team feel pt will be ready for discharge by end of this week. Asked for RNCM assigned to case-Cindi Rulon Eisenmenger [phone], will reach out to her, due to will need rails or ramp to get into home.  3;34 PM Spoke with Sherri-wife to discuss  Discharge needs. Spoke with [employer] to fax equipment needs. Made aware discharge end of week. Work on discharge needs.

## 2020-05-26 NOTE — Progress Notes (Signed)
[place] PHYSICAL MEDICINE & REHABILITATION PROGRESS NOTE  Subjective/Complaints: Ha some pain in right knee 1-2/10: discussed icing and placed nursing order. Moving bowels fairly Sleeping intermittently Vital signs stable  ROS: Denies CP, SOB, N/V/D  Objective: Vital Signs: Blood pressure 116/71, pulse 77, temperature 97.8 F (36.6 C), temperature source Oral, resp. rate 20, height 5\' 9"  (1.753 m), weight 99.8 kg, SpO2 99 %. No results found. Recent Labs    05/26/20 0628  WBC 12.9*  HGB 12.8*  HCT 38.2*  PLT 454*   No results for input(s): NA, K, CL, CO2, GLUCOSE, BUN, CREATININE, CALCIUM in the last 72 hours.  Intake/Output Summary (Last 24 hours) at 05/26/2020 1143 Last data filed at 05/26/2020 0818 Gross per 24 hour  Intake 840 ml  Output 3750 ml  Net -2910 ml        Physical Exam: BP 116/71 (BP Location: Left Arm)   Pulse 77   Temp 97.8 F (36.6 C) (Oral)   Resp 20   Ht 5\' 9"  (1.753 m)   Wt 99.8 kg   SpO2 99%   BMI 32.49 kg/m  Gen: no distress, normal appearing HEENT: oral mucosa pink and moist, NCAT Cardio: Reg rate Chest: normal effort, normal rate of breathing Abd: soft, non-distended Ext: no edema Psych: pleasant, normal affect Skin: intact Neuro: Alert Motor: B/l UE: 5/5 proximal to distal B/l LE: HF: 4/5, Knee limited due bracing, ADF 5/5  Assessment/Plan: 1. Functional deficits which require 3+ hours per day of interdisciplinary therapy in a comprehensive inpatient rehab setting.  Physiatrist is providing close team supervision and 24 hour management of active medical problems listed below.  Physiatrist and rehab team continue to assess barriers to discharge/monitor patient progress toward functional and medical goals   Care Tool:  Bathing    Body parts bathed by patient: Right arm,Left arm,Chest,Abdomen,Front perineal area,Buttocks,Right upper leg,Left upper leg,Face     Body parts n/a: Left lower leg,Right lower leg (covered by  brace)   Bathing assist Assist Level: Contact Guard/Touching assist     Upper Body Dressing/Undressing Upper body dressing   What is the patient wearing?: Pull over shirt    Upper body assist Assist Level: Set up assist    Lower Body Dressing/Undressing Lower body dressing      What is the patient wearing?: Pants     Lower body assist Assist for lower body dressing: Supervision/Verbal cueing     Toileting Toileting    Toileting assist Assist for toileting: Independent with assistive device Assistive Device Comment:  (urinal)   Transfers Chair/bed transfer  Transfers assist     Chair/bed transfer assist level: Contact Guard/Touching assist     Locomotion Ambulation   Ambulation assist      Assist level: Supervision/Verbal cueing Assistive device: Walker-rolling Max distance: 80'   Walk 10 feet activity   Assist     Assist level: Supervision/Verbal cueing Assistive device: Walker-rolling   Walk 50 feet activity   Assist    Assist level: Supervision/Verbal cueing Assistive device: Walker-rolling    Walk 150 feet activity   Assist Walk 150 feet activity did not occur: Safety/medical concerns         Walk 10 feet on uneven surface  activity   Assist     Assist level: Contact Guard/Touching assist Assistive device: Walker-rolling   Wheelchair     Assist Will patient use wheelchair at discharge?: Yes Type of Wheelchair: Manual    Wheelchair assist level: Supervision/Verbal cueing Max wheelchair distance:  150'    Wheelchair 50 feet with 2 turns activity    Assist        Assist Level: Supervision/Verbal cueing   Wheelchair 150 feet activity     Assist      Assist Level: Supervision/Verbal cueing    Medical Problem List and Plan: 1.  B/L quad tendon repair secondary to tendon ruptures B/L  secondary to a fall at work, with WBAT/no flexion of knees B/L  Continue CIR PT, OT  2.  Impaired  mobility: -DVT/anticoagulation:  Pharmaceutical: Continue Lovenox             -antiplatelet therapy: N/A 3. Pain Management:  Continue ultram 50 mg qid scheduled w/Oxycodone prn -->effective- not usually taking prns. Decrease oxycodone to 10mg .  4. Mood: LCSW to follow for evaluation and support.              -antipsychotic agents: N/A 5. Neuropsych: This patient is capable of making decisions on his own behalf. 6. Skin/Wound Care: Dressings to stay in place for 2 weeks (~4/13) post op till follow up with ortho.              --Routine pressure relief measures. Monitor for drainage.   7. Fluids/Electrolytes/Nutrition: Monitor I/Os 8. HTN: Monitor BP TID--continue irbesartan  Monitor with increased mobility 9. GERD: Continue Protonix. 10. Leucocytosis:   WBC trending down, repeat weekly.   Afebrile  Monitor for fevers and signs of infection. 11. New diagnosis T2DM: has been pre-diabetic. Now with Hgb A1C-7.1.     CBG (last 3)  Recent Labs    05/25/20 2115 05/26/20 0638 05/26/20 1137  GLUCAP 137* 116* 124*  controlled 4/9  12. ABLA:    Hemoglobin 12.7 on 4/8  Continue to monitor 13. Hyponatremia:   Sodium 134 on 4/8  Continue to monitor 14. Hypoalbuminemia:  Protein levels trending down.              added Ensure Max supplements with meals to promote healing.  15. Loose stools -  Decreased senokot S to 1 tab nightly from 2 tabs nightly  Now having BM qod  15.  Mild transaminitis  ALT mildly elevated on 4/8  LOS: 4 days A FACE TO FACE EVALUATION WAS PERFORMED  6/8 Morayma Godown 05/26/2020, 11:43 AM

## 2020-05-26 NOTE — Progress Notes (Signed)
Physical Therapy Session Note  Patient Details  Name: Arthur Ayers MRN: [id number] Date of Birth: [birth year]  Today's Date: 05/26/2020 PT Individual Time: 0840-0910 PT Individual Time Calculation (min): 30 min   Short Term Goals: Week 1:  PT Short Term Goal 1 (Week 1): STG=LTG due to ELOS  Skilled Therapeutic Interventions/Progress Updates:     Patient in recliner in the room upon PT arrival. Patient alert and agreeable to PT session. Patient reported 3-4/10 B lower extremity pain during session, RN made aware. PT provided repositioning, rest breaks, and distraction as pain interventions throughout session.   Per discussion with OT and RN prior to session, focused on doffing and re-applying B ACE wraps during session due to wraps rolling causing risk for skin breakdown and poor blood circulation. Patient able to assist with SLR, maintaining knee extension bilaterally to doff/don braces with mod A for distal straps and wraps with total A. Educated on importance of edema control on purpose for braces remaining locked in knee extension for improved ROM with mobility. Also, educated on wrapping technique focused on figure 8 wrapping, lack of wrinkles in wrap, and decreasing pressure distally>proximally for improved skin integrity, blood flow, and reduced distal edema.  Patient performed sit to/from stand using RW x1 for pressure relief x1 min and scooting in the chair with supervision. All mobility performed with B Bledsoe braces donned and locked out at 0 deg extension.   Patient in recliner at end of session with breaks locked, chair alarm set, and all needs within reach.    Therapy Documentation Precautions:  Precautions Precautions: Fall,Knee Precaution Comments: no knee flexion ROM Required Braces or Orthoses: Knee Immobilizer - Right,Knee Immobilizer - Left Knee Immobilizer - Right: On at all times Knee Immobilizer - Left: On at all times Restrictions Weight Bearing Restrictions:  No RLE Weight Bearing: Weight bearing as tolerated LLE Weight Bearing: Weight bearing as tolerated   Therapy/Group: Individual Therapy  [name] [name] PT, DPT  05/26/2020, 5:04 PM

## 2020-05-27 LAB — GLUCOSE, CAPILLARY
Glucose-Capillary: 109 mg/dL — ABNORMAL HIGH (ref 70–99)
Glucose-Capillary: 88 mg/dL (ref 70–99)
Glucose-Capillary: 98 mg/dL (ref 70–99)
Glucose-Capillary: 144 mg/dL — ABNORMAL HIGH (ref 70–99)

## 2020-05-27 MED ORDER — OXYCODONE HCL 5 MG PO TABS
10.0000 mg | ORAL_TABLET | Freq: Four times a day (QID) | ORAL | Status: DC | PRN
Start: 2020-05-27 — End: 2020-05-28

## 2020-05-27 NOTE — Progress Notes (Signed)
Physical Therapy Session Note  Patient Details  Name: Arthur Ayers MRN: [id number] Date of Birth: Oct 04, 1965  Today's Date: 05/27/2020 PT Individual Time: 0905-1002 PT Individual Time Calculation (min): 57 min   Short Term Goals: Week 1:  PT Short Term Goal 1 (Week 1): STG=LTG due to ELOS  Skilled Therapeutic Interventions/Progress Updates:    Patient in recliner and reports feeling well.  States working to see if railings to enter home might be better than a ramp.  Adjusted leg braces to tighten top to avoid slipping down on legs and cut ends of straps.  Patient sit to stand from recliner with S to RW.  Ambulated to therapy gym x 180' with S.  Patient negotiated obstacle course with RW after demonstration stepping over and around obstacles with S and increased time for lateral movements with RW.  Discussed width of railings to consider at home entry and measured between 27-29" on our rails in the gym.  Patient performed 4 way hip exercises standing at counter with S and cues x 10 each way.  Negotiated 4 steps with rails and S with cues for leg positioning for safer descent.  Patient ambulated to ortho gym x 100' with RW and S.  Performed car transfer with S to simulated [employer].  Simulated back seat sliding in keeping legs on seat.  Patient ambulated back to room with RW and S.  Left seated in recliner with all needs in reach, ice to knees.  Therapy Documentation Precautions:  Precautions Precautions: Fall,Knee Precaution Comments: no knee flexion ROM Required Braces or Orthoses: Knee Immobilizer - Right,Knee Immobilizer - Left Knee Immobilizer - Right: On at all times Knee Immobilizer - Left: On at all times Restrictions Weight Bearing Restrictions: No RLE Weight Bearing: Weight bearing as tolerated LLE Weight Bearing: Weight bearing as tolerated Pain: Pain Assessment Pain Scale: 0-10 Pain Score: 3  Pain Location: Knee Pain Orientation: Right;Left Pain Descriptors /  Indicators: Aching Pain Onset: With Activity Pain Intervention(s): Repositioned;Cold applied;Rest   Therapy/Group: Individual Therapy  Elray Mcgregor  Sheran Lawless, PT 05/27/2020, 9:39 AM

## 2020-05-27 NOTE — Progress Notes (Signed)
Patient ID: Arthur Ayers, male   DOB: 07/10/1965, 55 y.o.   MRN: [id number]  Spoke with [facility] for Workers Comp who has received the prescriptions for equipment and ramp. Voiced it will be a rush for the end of the week. Unsure if can be done by then. She is meeting with pt and wife today after therapies. Continue to work on discharge needs.

## 2020-05-27 NOTE — Progress Notes (Signed)
[place] PHYSICAL MEDICINE & REHABILITATION PROGRESS NOTE  Subjective/Complaints: Minimal pain in right knee. Has been icing as needed Sleeping well Moving bowels regularly. Patient's chart reviewed- No issues reported overnight Vitals signs stable  ROS: Denies CP, SOB, N/V/D  Objective: Vital Signs: Blood pressure 120/70, pulse 85, temperature 97.9 F (36.6 C), resp. rate 19, height 5\' 9"  (1.753 m), weight 99.8 kg, SpO2 97 %. No results found. Recent Labs    05/26/20 0628  WBC 12.9*  HGB 12.8*  HCT 38.2*  PLT 454*   Recent Labs    05/26/20 1132  NA 132*  K 4.1  CL 97*  CO2 28  GLUCOSE 123*  BUN 17  CREATININE 0.95  CALCIUM 9.0    Intake/Output Summary (Last 24 hours) at 05/27/2020 1437 Last data filed at 05/27/2020 0715 Gross per 24 hour  Intake 458 ml  Output 1800 ml  Net -1342 ml        Physical Exam: BP 120/70 (BP Location: Left Arm)   Pulse 85   Temp 97.9 F (36.6 C)   Resp 19   Ht 5\' 9"  (1.753 m)   Wt 99.8 kg   SpO2 97%   BMI 32.49 kg/m  Gen: no distress, normal appearing HEENT: oral mucosa pink and moist, NCAT Cardio: Reg rate Chest: normal effort, normal rate of breathing Abd: soft, non-distended Ext: no edema Psych: pleasant, normal affect Skin: intact Neuro: Alert Motor: B/l UE: 5/5 proximal to distal B/l LE: HF: 4/5, Knee limited due bracing, ADF 5/5  Assessment/Plan: 1. Functional deficits which require 3+ hours per day of interdisciplinary therapy in a comprehensive inpatient rehab setting.  Physiatrist is providing close team supervision and 24 hour management of active medical problems listed below.  Physiatrist and rehab team continue to assess barriers to discharge/monitor patient progress toward functional and medical goals   Care Tool:  Bathing    Body parts bathed by patient: Right arm,Left arm,Chest,Abdomen,Front perineal area,Buttocks,Right upper leg,Left upper leg,Face     Body parts n/a: Left lower leg,Right  lower leg (covered by brace)   Bathing assist Assist Level: Contact Guard/Touching assist     Upper Body Dressing/Undressing Upper body dressing   What is the patient wearing?: Pull over shirt    Upper body assist Assist Level: Set up assist    Lower Body Dressing/Undressing Lower body dressing      What is the patient wearing?: Underwear/pull up,Pants     Lower body assist Assist for lower body dressing: Supervision/Verbal cueing     Toileting Toileting    Toileting assist Assist for toileting: Supervision/Verbal cueing Assistive Device Comment:  (urinal)   Transfers Chair/bed transfer  Transfers assist     Chair/bed transfer assist level: Contact Guard/Touching assist     Locomotion Ambulation   Ambulation assist      Assist level: Supervision/Verbal cueing Assistive device: Walker-rolling Max distance: 80'   Walk 10 feet activity   Assist     Assist level: Supervision/Verbal cueing Assistive device: Walker-rolling   Walk 50 feet activity   Assist    Assist level: Supervision/Verbal cueing Assistive device: Walker-rolling    Walk 150 feet activity   Assist Walk 150 feet activity did not occur: Safety/medical concerns         Walk 10 feet on uneven surface  activity   Assist     Assist level: Contact Guard/Touching assist Assistive device: Walker-rolling   Wheelchair     Assist Will patient use wheelchair at discharge?: Yes  Type of Wheelchair: Manual    Wheelchair assist level: Supervision/Verbal cueing Max wheelchair distance: 150'    Wheelchair 50 feet with 2 turns activity    Assist        Assist Level: Supervision/Verbal cueing   Wheelchair 150 feet activity     Assist      Assist Level: Supervision/Verbal cueing    Medical Problem List and Plan: 1.  B/L quad tendon repair secondary to tendon ruptures B/L  secondary to a fall at work, with WBAT/no flexion of knees B/L  Continue CIR PT, OT   2.  Impaired mobility: -DVT/anticoagulation:  Pharmaceutical: d/c Lovenox since ambulating >250 feet             -antiplatelet therapy: N/A 3. Pain Management:  Continue ultram 50 mg qid scheduled w/Oxycodone prn -->effective- not usually taking prns. Decrease oxycodone to 10mg  q6H prn  4. Mood: LCSW to follow for evaluation and support.              -antipsychotic agents: N/A 5. Neuropsych: This patient is capable of making decisions on his own behalf. 6. Skin/Wound Care: Dressings to stay in place for 2 weeks (~4/13) post op till follow up with ortho.              --Routine pressure relief measures. Monitor for drainage.   7. Fluids/Electrolytes/Nutrition: Monitor I/Os 8. HTN: Monitor BP TID--continue irbesartan  Monitor with increased mobility 9. GERD: Continue Protonix. 10. Leucocytosis:   WBC trending down, repeat weekly.   Afebrile  Monitor for fevers and signs of infection. 11. New diagnosis T2DM: has been pre-diabetic. Now with Hgb A1C-7.1.     CBG (last 3)  Recent Labs    05/26/20 2102 05/27/20 0601 05/27/20 1125  GLUCAP 129* 109* 88  slightly elevated to 88-129. Cr normal. Discuss with patient no sugar diet. D/c ISS.  12. ABLA:    Hemoglobin 12.7 on 4/8  Continue to monitor 13. Hyponatremia:   Sodium 134 on 4/8  Continue to monitor 14. Hypoalbuminemia:  Protein levels trending down.              added Ensure Max supplements with meals to promote healing.  15. Loose stools -  Decreased senokot S to 1 tab nightly from 2 tabs nightly  Now having BM qod  15.  Mild transaminitis  ALT mildly elevated on 4/8 16. Obesity BMI 32.49: provide dietary education  LOS: 5 days A FACE TO FACE EVALUATION WAS PERFORMED  6/8 Pharaoh Pio 05/27/2020, 2:37 PM

## 2020-05-27 NOTE — Progress Notes (Signed)
Occupational Therapy Session Note  Patient Details  Name: Arthur Ayers MRN: [id number] Date of Birth: 24-Jan-1966  Today's Date: 05/27/2020 OT Individual Time: 4163-8453   &   1400-1500 OT Individual Time Calculation (min): 60 min   &    60 min  Short Term Goals: Week 1:  OT Short Term Goal 1 (Week 1): STG = LTG 2/2 ELOS  Skilled Therapeutic Interventions/Progress Updates:    AM session:   Patient seated in recliner, states that he was able to donn shorts and clean shirt prior to my arrival this am.  He states that pain is under control at this time.  Mod A to donn socks and shoes.  Sit to stand and ambulation in room and on unit with CS.  He was able to walk 200+ feet x4.  Reviewed railing options for home - directed him to images and products available.  Reviewed DME and home set up.  He returned to recliner at close of session. Call bell and tray table in reach, ice packs provided.     PM session:   Patient seated in recliner, ready for therapy session.  SPT with RW to/from recliner and w/c CS.  Able to propel w/c on unit, elevators and to outdoor area.  Completed ambulation on outdoor patio surfaces, practiced side stepping with CS.  Reviewed obstacle negotiation and removal of obstacles for safety in home environment.  Completed standing reach and trunk activities, ankle and hip strategies.  He returned to recliner at close of session, call bell and tray table in reach.       Therapy Documentation Precautions:  Precautions Precautions: Fall,Knee Precaution Comments: no knee flexion ROM Required Braces or Orthoses: Knee Immobilizer - Right,Knee Immobilizer - Left Knee Immobilizer - Right: On at all times Knee Immobilizer - Left: On at all times Restrictions Weight Bearing Restrictions: No RLE Weight Bearing: Weight bearing as tolerated LLE Weight Bearing: Weight bearing as tolerated  Therapy/Group: Individual Therapy  [name] 05/27/2020, 7:48 AM

## 2020-05-28 LAB — GLUCOSE, CAPILLARY
Glucose-Capillary: 100 mg/dL — ABNORMAL HIGH (ref 70–99)
Glucose-Capillary: 124 mg/dL — ABNORMAL HIGH (ref 70–99)
Glucose-Capillary: 113 mg/dL — ABNORMAL HIGH (ref 70–99)

## 2020-05-28 MED ORDER — OXYCODONE HCL 5 MG PO TABS: 10.0000 mg | ORAL_TABLET | Freq: Three times a day (TID) | ORAL | Status: DC | PRN

## 2020-05-28 NOTE — Progress Notes (Signed)
Occupational Therapy Session Note  Patient Details  Name: Arthur Ayers MRN: [id number] Date of Birth: 1965/12/23  Today's Date: 05/28/2020 OT Individual Time: 1216-2446 OT Individual Time Calculation (min): 47 min    Short Term Goals: Week 1:  OT Short Term Goal 1 (Week 1): STG = LTG 2/2 ELOS  Skilled Therapeutic Interventions/Progress Updates:    Pt received seated in recliner, agreeable to therapy. C/o of 3/10 R knee pain, currently icing. Completes STS and amb with RW throughout session with overall close S and CGA from low surfaces. Amb to toilet and stood to [employer] with distant S. Amb to lobby to practice obstacle navigation, activity tolerance, energy conservation, and STS from various height surfaces. Edu on purpose of mod RPE for self monitoring fatigue level and need for rest breaks. Pt reports he is at a 5/10. Located items in gift shop while navigating obstacles in prep for IADL and community reintegration. In gym, completed 1x15 modified sit ups against incline and participated in various UB stretches. Pt reports cramp after sit-ups in abdomen, resolves with stretching and extended rest break. Amb transfer back to recliner.  Pt left in recliner with BLE extended, chair alarm engaged, call bell in reach, and all immediate needs met.    Therapy Documentation Precautions:  Precautions Precautions: Fall,Knee Precaution Comments: no knee flexion ROM Required Braces or Orthoses: Knee Immobilizer - Right,Knee Immobilizer - Left Knee Immobilizer - Right: On at all times Knee Immobilizer - Left: On at all times Restrictions Weight Bearing Restrictions: No RLE Weight Bearing: Weight bearing as tolerated LLE Weight Bearing: Weight bearing as tolerated Pain: 3/10 R knee   ADL: See Care Tool for more details.  Therapy/Group: Individual Therapy  Volanda Napoleon MS, OTR/L  05/28/2020, 12:16 PM

## 2020-05-28 NOTE — Progress Notes (Signed)
Occupational Therapy Session Note  Patient Details  Name: Arthur Ayers MRN: [id number] Date of Birth: 01/19/1966  Today's Date: 05/28/2020 OT Individual Time: 0732-0829 OT Individual Time Calculation (min): 57 min    Short Term Goals: Week 1:  OT Short Term Goal 1 (Week 1): STG = LTG 2/2 ELOS   Skilled Therapeutic Interventions/Progress Updates:    Pt greeted at time of session semireclined in bed agreeable to OT session, no pain throughout. Braces remained on BLEs throughout session. Supine > sit Mod I and walked bed > gather clothes at drawers > bathroom Supervision with bariwalker. Transferred to [facility] over toilet same manner, performed toileting/bathing/dressing with set up/supervision overall and would require Min A only for thoroughly washing feet but declined to do this today. Walked bathroom > sink and oral hygiene with Supervision only for standing balance. Self propel wheelchair > ADL aparmtent and performed IADL retraining for retrieving items from various cabinets and fridge heights to simulate at home, performed with Supervision for new environment only, remembering hand/foot placement and AD management from earlier OT session. Back in room up in recliner with alarm on call bell in reach.   Therapy Documentation Precautions:  Precautions Precautions: Fall,Knee Precaution Comments: no knee flexion ROM Required Braces or Orthoses: Knee Immobilizer - Right,Knee Immobilizer - Left Knee Immobilizer - Right: On at all times Knee Immobilizer - Left: On at all times Restrictions Weight Bearing Restrictions: No RLE Weight Bearing: Weight bearing as tolerated LLE Weight Bearing: Weight bearing as tolerated     Therapy/Group: Individual Therapy  Erasmo Score 05/28/2020, 7:19 AM

## 2020-05-28 NOTE — Progress Notes (Signed)
Physical Therapy Session Note  Patient Details  Name: Arthur Ayers MRN: [id number] Date of Birth: [birth year]  Today's Date: 05/28/2020 PT Individual Time: Session1:0830-0900; [name]: [id number] PT Individual Time Calculation (min): 30 min & 58 min  Short Term Goals: Week 1:  PT Short Term Goal 1 (Week 1): STG=LTG due to ELOS  Skilled Therapeutic Interventions/Progress Updates:    Session1:  Patient in recliner and reports just finished with OT.  Patient sit to stand with S to RW.  Transferred to w/c with S and propelled to therapy gym with S.  Patient performed sit to supine on mat with S and performed therex as noted below.  Patient performed side stepping up ramp both directions with S noted more difficult going up to R due to RW LE pain.  Patient propelled in w/c to room.  Transferred to recliner with S and left with ice on knees and needs in reach.  Session2:Patient in recliner and reports feeling well.  Sit to stand with S and pivot with RW to w/c with S.  Propelled in w/c with S to first floor and out to courtyard [facility] main entrance ~300'.  Patient ambulated outdoors on unlevel pavement with RW and S x 180'.  Patient  propelled w/c 98' then noted arms sore so pushed to elevator and to ortho gym.  Patient performed standing hip extension, abduction, flexion and heel raises x 10 each standing with UE support on counter.  Side stepping up ramp to L x 2, then to R x 2.  Patient standing at [facility] for one handed support to tap targets on screen with R then L hand with S (tapping A-1, B-2, etc with minimal cues).  Patient propelled in w/c to room.  Stand step to recliner with S using RW.  Left with legs elevated, ice to knees and all needs in reach.   Therapy Documentation Precautions:  Precautions Precautions: Fall,Knee Precaution Comments: no knee flexion ROM Required Braces or Orthoses: Knee Immobilizer - Right,Knee Immobilizer - Left Knee Immobilizer - Right: On at all  times Knee Immobilizer - Left: On at all times Restrictions Weight Bearing Restrictions: No RLE Weight Bearing: Weight bearing as tolerated LLE Weight Bearing: Weight bearing as tolerated Pain: Pain Assessment Pain Scale: 0-10 Pain Score: 0-No pain Exercises: General Exercises - Lower Extremity Ankle Circles/Pumps: AROM;20 reps;Supine;Both Quad Sets: Strengthening;10 reps;Both;Supine Hip ABduction/ADduction: Both;10 reps;Supine;Strengthening Total Joint Exercises Towel Squeeze: Strengthening;Both;10 reps;Supine (5 sec hold) Straight Leg Raises: AAROM;Both;10 reps;Supine Bridges: Strengthening;Both;Supine (legs extended on wedge with 5 sec hold)   Therapy/Group: Individual Therapy  Elray Mcgregor  Sheran Lawless, PT 05/28/2020, 9:26 AM

## 2020-05-28 NOTE — Progress Notes (Signed)
Patient ID: Arthur Ayers, male   DOB: 1965-03-23, 54 y.o.   MRN: [id number]  Met with pt and his Mom to inform of team conference getting close to goal level of supervision level and discharge date based upon workers comp getting equipment and ramp into home. Pt and wife met with Cindi Felix-RNCM yesterday, she will be following them at home. Await return call from Cindi regarding status of equipment and ramp.

## 2020-05-28 NOTE — Progress Notes (Signed)
[place] PHYSICAL MEDICINE & REHABILITATION PROGRESS NOTE  Subjective/Complaints: Mother at bedside He has no complaints Pain is 1/0, 3/10 with therapy  ROS: Denies CP, SOB, N/V/D  Objective: Vital Signs: Blood pressure 121/65, pulse 81, temperature 98 F (36.7 C), temperature source Oral, resp. rate 17, height 5\' 9"  (1.753 m), weight 99.8 kg, SpO2 100 %. No results found. Recent Labs    05/26/20 0628  WBC 12.9*  HGB 12.8*  HCT 38.2*  PLT 454*   Recent Labs    05/26/20 1132  NA 132*  K 4.1  CL 97*  CO2 28  GLUCOSE 123*  BUN 17  CREATININE 0.95  CALCIUM 9.0    Intake/Output Summary (Last 24 hours) at 05/28/2020 1028 Last data filed at 05/28/2020 0745 Gross per 24 hour  Intake 915 ml  Output 900 ml  Net 15 ml        Physical Exam: BP 121/65 (BP Location: Left Arm)   Pulse 81   Temp 98 F (36.7 C) (Oral)   Resp 17   Ht 5\' 9"  (1.753 m)   Wt 99.8 kg   SpO2 100%   BMI 32.49 kg/m  Gen: no distress, normal appearing HEENT: oral mucosa pink and moist, NCAT Cardio: Reg rate Chest: normal effort, normal rate of breathing Abd: soft, non-distended Ext: no edema Psych: pleasant, normal affect Skin: intact Neuro: Alert Motor: B/l UE: 5/5 proximal to distal B/l LE: HF: 4/5, Knee limited due bracing, ADF 5/5  Assessment/Plan: 1. Functional deficits which require 3+ hours per day of interdisciplinary therapy in a comprehensive inpatient rehab setting.  Physiatrist is providing close team supervision and 24 hour management of active medical problems listed below.  Physiatrist and rehab team continue to assess barriers to discharge/monitor patient progress toward functional and medical goals   Care Tool:  Bathing    Body parts bathed by patient: Right arm,Left arm,Chest,Abdomen,Front perineal area,Buttocks,Right upper leg,Left upper leg,Face     Body parts n/a: Left lower leg,Right lower leg (covered by brace)   Bathing assist Assist Level: Contact  Guard/Touching assist     Upper Body Dressing/Undressing Upper body dressing   What is the patient wearing?: Pull over shirt    Upper body assist Assist Level: Set up assist    Lower Body Dressing/Undressing Lower body dressing      What is the patient wearing?: Underwear/pull up,Pants     Lower body assist Assist for lower body dressing: Supervision/Verbal cueing     Toileting Toileting    Toileting assist Assist for toileting: Supervision/Verbal cueing Assistive Device Comment:  (urinal)   Transfers Chair/bed transfer  Transfers assist     Chair/bed transfer assist level: Contact Guard/Touching assist     Locomotion Ambulation   Ambulation assist      Assist level: Supervision/Verbal cueing Assistive device: Walker-rolling Max distance: 80'   Walk 10 feet activity   Assist     Assist level: Supervision/Verbal cueing Assistive device: Walker-rolling   Walk 50 feet activity   Assist    Assist level: Supervision/Verbal cueing Assistive device: Walker-rolling    Walk 150 feet activity   Assist Walk 150 feet activity did not occur: Safety/medical concerns         Walk 10 feet on uneven surface  activity   Assist     Assist level: Contact Guard/Touching assist Assistive device: Walker-rolling   Wheelchair     Assist Will patient use wheelchair at discharge?: Yes Type of Wheelchair: 05/30/2020  assist level: Supervision/Verbal cueing Max wheelchair distance: 150'    Wheelchair 50 feet with 2 turns activity    Assist        Assist Level: Supervision/Verbal cueing   Wheelchair 150 feet activity     Assist      Assist Level: Supervision/Verbal cueing    Medical Problem List and Plan: 1.  B/L quad tendon repair secondary to tendon ruptures B/L  secondary to a fall at work, with WBAT/no flexion of knees B/L  Continue CIR PT, OT  2.  Impaired mobility: -DVT/anticoagulation:  Pharmaceutical: d/c  Lovenox since ambulating >250 feet             -antiplatelet therapy: N/A 3. Pain Management:  Continue ultram 50 mg qid scheduled w/Oxycodone prn -->effective- not usually taking prns. Decrease oxycodone to 10mg  q8H prn  4. Mood: LCSW to follow for evaluation and support.              -antipsychotic agents: N/A 5. Neuropsych: This patient is capable of making decisions on his own behalf. 6. Skin/Wound Care: Dressings to stay in place for 2 weeks (~4/13) post op till follow up with ortho.Will request ortho evaluation prior to d/c              --Routine pressure relief measures. Monitor for drainage.   7. Fluids/Electrolytes/Nutrition: Monitor I/Os 8. HTN: Monitor BP TID--continue irbesartan  Monitor with increased mobility 9. GERD: Continue Protonix. 10. Leucocytosis:   WBC trending down, repeat weekly.   Afebrile  Monitor for fevers and signs of infection. 11. New diagnosis T2DM: has been pre-diabetic. Now with Hgb A1C-7.1.     CBG (last 3)  Recent Labs    05/27/20 1125 05/27/20 1639 05/27/20 2117  GLUCAP 88 98 144*  slightly elevated to 88-129. Cr normal. Discuss with patient no sugar diet. D/c ISS.  12. ABLA:    Hemoglobin 12.7 on 4/8  Continue to monitor 13. Hyponatremia:   Sodium 134 on 4/8  Continue to monitor 14. Hypoalbuminemia:  Protein levels trending down.              added Ensure Max supplements with meals to promote healing.  15. Loose stools -  Decreased senokot S to 1 tab nightly from 2 tabs nightly  Now having BM qod  15.  Mild transaminitis  ALT mildly elevated on 4/8 16. Obesity BMI 32.49: provide dietary education  LOS: 6 days A FACE TO FACE EVALUATION WAS PERFORMED  6/8 P Jenipher Havel 05/28/2020, 10:28 AM

## 2020-05-28 NOTE — Patient Care Conference (Signed)
Inpatient RehabilitationTeam Conference and Plan of Care Update Date: 05/28/2020   Time: 11:44 AM    Patient Name: Arthur Ayers      Medical Record Number: [id number]  Date of Birth: 30-Aug-1965 Sex: Male         Room/Bed: 4M10C/4M10C-01 Payor Info: Payor: [employer] / Plan: [employer] / Product Type: *No Product type* /    Admit Date/Time:  05/22/2020  2:27 PM  Primary Diagnosis:  Patellar tendon rupture, unspecified laterality, sequela  Hospital Problems: Principal Problem:   Patellar tendon rupture, unspecified laterality, sequela Active Problems:   CKD (chronic kidney disease) stage 2, GFR 60-89 ml/min   Essential hypertension   Type II diabetes mellitus (HCC)   Diabetes mellitus, new onset (HCC)   Hyponatremia   Leukocytosis    Expected Discharge Date: Expected Discharge Date:  (pending WC/ramp + Therapist, occupational for steps)  Team Members Present: Physician leading conference: Dr. Sula Soda Care Coodinator Present: Chana Bode, RN, BSN, CRRN;Becky Dupree, LCSW Nurse Present: Chana Bode, RN PT Present: Sheran Lawless, PT OT Present: Earleen Newport, OT PPS Coordinator present : Fae Pippin, SLP     Current Status/Progress Goal Weekly Team Focus  Bowel/Bladder   Patient is currently continent of bowel and bladder. Last bm: 05/26/2020  Patient will continue to be continent of bowel and bladder to maintain a normal elimination pattern.  Team will offer patient toileting q2h and prn.   Swallow/Nutrition/ Hydration             ADL's   UB bathing/dressing/grooming mod I seated at sink, LB bathing assist for feet, legs na due to dressings to remain intact, dressing min A due to locked knee braces, functional transfers CS  CS/min A  discharge prep, general conditioning, balance, adl/mobility   Mobility   S bed mobility, transfers, ambulation with RW, car transfers and CGA steps x 4 with bilateral rails, S w/c mobility  S overall  managing in tight spaces  with bariRW, standing balance, hip and ankle strength, stairs for home entry   Communication             Safety/Cognition/ Behavioral Observations            Pain   Patient currently states his pain is 0/10.  Patient will remain pain free.  Team will assess patients pain qshift. Administer scheduled pain medication per order. Administer prn pain medicaion when needed per order.   Skin   Patient has a surgical incision to both the left and right leg.  Incisions to both legs will remain free from s/s of infection. Remainder of patients skin will continue to be intact.  Team will assess patients skin q2h/prn.     Discharge Planning:  Home with wife who will work from home until pt mod/i, have send prescriptions to [employer] for equipment and ramp. Aware DC by end of week, they are unsure if can get all in place by then   Team Discussion: Addressing T2 DM, Hyponatremia, HTN along with patellar tendon injuries.   Patient on target to meet rehab goals: yes, currently supervision for ambulation and set up for bathing and dressing except for feet. Requires supervision for transfers and hygiene.  *See Care Plan and progress notes for long and short-term goals.   Revisions to Treatment Plan:  Provided strengthening exercises  Teaching Needs: Transfers, assistance for B+D lower extremities/hygiene, medications, co-morbidity management, etc.   Current Barriers to Discharge: Decreased caregiver support, Home enviroment access/layout and  Weight bearing restrictions  Possible Resolutions to Barriers: Recommend portable ramp or rails for entry to home Bariatric walker ( to accommodate braces)     Medical Summary Current Status: Pain has been well controlled, newly diagnosed type 2 DM, hyponatremia, anemia  Barriers to Discharge: Medical stability;Wound care  Barriers to Discharge Comments: Pain due to patellar tendon rupture, newly diagnosed type 2 DM, hyponatremia, anemia Possible  Resolutions to [employer] Focus: continue ice, wean oxycodone, monitor insicion daily, continue dietary education, monitor Na outpatient, monitor Hgb outpatient   Continued Need for Acute Rehabilitation Level of Care: The patient requires daily medical management by a physician with specialized training in physical medicine and rehabilitation for the following reasons: Direction of a multidisciplinary physical rehabilitation program to maximize functional independence : Yes Medical management of patient stability for increased activity during participation in an intensive rehabilitation regime.: Yes Analysis of laboratory values and/or radiology reports with any subsequent need for medication adjustment and/or medical intervention. : Yes   I attest that I was present, lead the team conference, and concur with the assessment and plan of the team.   Chana Bode B 05/28/2020, 2:57 PM

## 2020-05-29 LAB — GLUCOSE, CAPILLARY
Glucose-Capillary: 114 mg/dL — ABNORMAL HIGH (ref 70–99)
Glucose-Capillary: 118 mg/dL — ABNORMAL HIGH (ref 70–99)
Glucose-Capillary: 90 mg/dL (ref 70–99)
Glucose-Capillary: 155 mg/dL — ABNORMAL HIGH (ref 70–99)

## 2020-05-29 MED ORDER — ASPIRIN 81 MG PO CHEW
81.0000 mg | CHEWABLE_TABLET | Freq: Two times a day (BID) | ORAL | Status: DC
  Administered 2020-05-29 – 2020-06-06 (×16): 81 mg via ORAL
  Filled 2020-05-29 (×16): qty 1

## 2020-05-29 MED ORDER — OXYCODONE HCL 5 MG PO TABS
10.0000 mg | ORAL_TABLET | Freq: Two times a day (BID) | ORAL | Status: DC | PRN
Start: 2020-05-29 — End: 2020-05-30

## 2020-05-29 NOTE — Progress Notes (Signed)
Physical Therapy Session Note  Patient Details  Name: Arthur Ayers MRN: [id number] Date of Birth: [birth year]  Today's Date: 05/29/2020 PT Individual Time: 7494-4967 and 1001-1030 and 1415-1515 PT Individual Time Calculation (min): 29 min and 29 min. And 60 min.  Short Term Goals: Week 1:  PT Short Term Goal 1 (Week 1): STG=LTG due to ELOS  Skilled Therapeutic Interventions/Progress Updates:   First session:  Pt presents in BR w/ NT and agreeable to therapy.  Pt stood at sink for personal care w/ 1UE or w/o UE support.  Pt amb at least 150' multiple trials w/ RW and supervision, reciprocal gait pattern, occasional verbal cues for posture and walker management, especially w/ turns.  Pt negotiated ramped surface w/ supervision.  Pt returned to room and remained sitting in recliner, LEs elevated.  Seat alarm on and all needs in reach.  Second session:  Pt presents sitting in recliner and agreeable to therapy.  Pt amb > 150' w/ RW and supervision, reciprocal gait pattern.  Pt negotiated (4) 6" steps w/ B rails and supervision.  Pt performed step-to gait pattern, w/ LLE ascending and descending w/ RLE.  Pt negotiates w/ wide BOS to clear LEs.  Pt amb w/ improved upright posture and increased WB to LEs.  Pt states a fear of WB fully to LEs, causing fatigue to triceps MM., but attempting more WB to LEs.  Pt returned to recliner w/ seat alarm on and all needs in reach.  Spoke w/ SW re: need for outpt PT once able to remove Bledsoes and exercise knees.  Hand rails into home may be more beneficial than ramp as pt is already performing stairs w/ B rails.  Third session:  Pt presents sitting in recliner and agreeable to therapy.  Pt transfers sit to stand w/ supervision.  Pt amb 300+ ' in hallways and on ramped flooring.  Pt then negotiated w/c 200+' w/ B UEs and supervision including on/off elevators and on uneven surfaces outside.  Pt amb multiple trials w/ RW and supervision on outdoor surfaces w/o LOB >  100' including turns.  Pt transferred stand <> sit from bench and arm chair w/ supervision, verbal cues for improved performance/independence from all surfaces.  Pt negotiated w/c into hospital and on/off elevators w/ supervision.  Pt required seated rest break.  Pt then amb 300+' to room and into recliner.  Seat alarm on and all needs in reach.     Therapy Documentation Precautions:  Precautions Precautions: Fall,Knee Precaution Comments: no knee flexion ROM Required Braces or Orthoses: Knee Immobilizer - Right,Knee Immobilizer - Left Knee Immobilizer - Right: On at all times Knee Immobilizer - Left: On at all times Restrictions Weight Bearing Restrictions: No RLE Weight Bearing: Weight bearing as tolerated LLE Weight Bearing: Weight bearing as tolerated General:   Vital Signs:   Pain:0/10      Therapy/Group: Individual Therapy  Lucio Edward 05/29/2020, 9:01 AM

## 2020-05-29 NOTE — Progress Notes (Signed)
Patient ID: Arthur Ayers, male   DOB: 1965-09-30, 55 y.o.   MRN: [id number]  Spoke with Cindi-RNCM for Workers [employer] has all equipment ordered and some has been delivered. Unsure regarding ramp versus rail and when would be put up. Can not find a home health agency to service pt in the city and with insurance, may need to do OP. Aware pt would like to go home by this weekend.

## 2020-05-29 NOTE — Progress Notes (Signed)
Occupational Therapy Session Note  Patient Details  Name: Arthur Ayers MRN: [id number] Date of Birth: 22-Jul-1965  Today's Date: 05/29/2020 OT Individual Time: 1030-1055 OT Individual Time Calculation (min): 25 min    Short Term Goals: Week 1:  OT Short Term Goal 1 (Week 1): STG = LTG 2/2 ELOS  Skilled Therapeutic Interventions/Progress Updates:    Pt resting in reccliner upon arrival. Sit<>stand, standing balance, and functional amb in room with RW at supervision level. No LOB noted. Discussed discharge home. Pt awaiting worker's comp arrangements for equipment. Pt returned to recliner. All needs within reach and seat alarm ativated.  Therapy Documentation Precautions:  Precautions Precautions: Fall,Knee Precaution Comments: no knee flexion ROM Required Braces or Orthoses: Knee Immobilizer - Right,Knee Immobilizer - Left Knee Immobilizer - Right: On at all times Knee Immobilizer - Left: On at all times Restrictions Weight Bearing Restrictions: No RLE Weight Bearing: Weight bearing as tolerated LLE Weight Bearing: Weight bearing as tolerated   Pain:  "Just itching"   Therapy/Group: Individual Therapy  Rich Brave 05/29/2020, 11:02 AM

## 2020-05-29 NOTE — Progress Notes (Signed)
Occupational Therapy Session Note  Patient Details  Name: Arthur Ayers MRN: [id number] Date of Birth: January 20, 1966  Today's Date: 05/29/2020 OT Individual Time: 1300-1355 OT Individual Time Calculation (min): 55 min    Short Term Goals: Week 1:  OT Short Term Goal 1 (Week 1): STG = LTG 2/2 ELOS  Skilled Therapeutic Interventions/Progress Updates:    Pt resting in recliner upon arrival. OT intervention with focus on functional amb with RW, standing balance, sit<>stand, w/c mobility, activity tolerance, and safety awareness to increase independence with BADLs. Pt requested to use toilet and amb with RW to bathroom. Pt stood at toilet to void and returned to room. Standing at sink to wash hands. Pt transferred to w/c and propelled w/c to central elevators. Pt propelled outside and practiced amb with RW on uneven surface in courtyard. Pt also practiced amb with RW in gift shop and navigated all aisle spaces without difficulty. Pt returned to room and returned to recliner. Pt remained in recliner with all needs within reach. Seat alarm activated. Therapy Documentation Precautions:  Precautions Precautions: Fall,Knee Precaution Comments: no knee flexion ROM Required Braces or Orthoses: Knee Immobilizer - Right,Knee Immobilizer - Left Knee Immobilizer - Right: On at all times Knee Immobilizer - Left: On at all times Restrictions Weight Bearing Restrictions: No RLE Weight Bearing: Weight bearing as tolerated LLE Weight Bearing: Weight bearing as tolerated Pain: Pain Assessment Pain Scale: 0-10 Pain Score: 0-No pain  Therapy/Group: Individual Therapy  Rich Brave 05/29/2020, 2:02 PM

## 2020-05-29 NOTE — Progress Notes (Signed)
Subjective: Patient reports pain as mild. Feeling much better than when I last saw him several days ago. Tolerating diet.  Urinating. No CP, SOB. Making progress with PT/OT on mobilizing everyday. Excited to get dressings taken off today. Hoping to go home soon. Feels he is ready to handle that.   Objective:   VITALS:   Vitals:   05/28/20 0449 05/28/20 1305 05/28/20 1929 05/29/20 0318  BP: 121/65 122/66 132/73 124/70  Pulse: 81 78 68 64  Resp: 17 18 16 20   Temp: 98 F (36.7 C) 98 F (36.7 C) 98 F (36.7 C) 97.8 F (36.6 C)  TempSrc: Oral   Oral  SpO2:  99% 100% 99%  Weight:      Height:       CBC Latest Ref Rng & Units 05/26/2020 05/23/2020 05/19/2020  WBC 4.0 - 10.5 K/uL 12.9(H) 14.0(H) 14.4(H)  Hemoglobin 13.0 - 17.0 g/dL 12.8(L) 12.7(L) 11.5(L)  Hematocrit 39.0 - 52.0 % 38.2(L) 38.6(L) 34.4(L)  Platelets 150 - 400 K/uL 454(H) 391 275   BMP Latest Ref Rng & Units 05/26/2020 05/23/2020 05/19/2020  Glucose 70 - 99 mg/dL 07/19/2020) 427(C) 623(J)  BUN 6 - 20 mg/dL 17 12 11   Creatinine 0.61 - 1.24 mg/dL 628(B 1.51  Sodium 135 - 145 mmol/L 132(L) 134(L) 130(L)  Potassium 3.5 - 5.1 mmol/L 4.1 4.2 4.1  Chloride 98 - 111 mmol/L 97(L) 102 99  CO2 22 - 32 mmol/L 28 26 23   Calcium 8.9 - 10.3 mg/dL 9.0 7.61) 7.9(L)   Intake/Output      04/13 0701 04/14 0700 04/14 0701 04/15 0700   P.O. 1218 240   Total Intake(mL/kg) 1218 (12.2) 240 (2.4)   Urine (mL/kg/hr) 925 (0.4)    Total Output 925    Net +293 +240        Urine Occurrence 4 x 1 x   Stool Occurrence 1 x 1 x      Physical Exam: General: NAD. Sitting up in bed watching tv. Calm, comfortable.  Resp: No increased wob Cardio: regular rate and rhythm ABD soft Neurologically intact MSK Neurovascularly intact Sensation intact distally Intact pulses distally Dorsiflexion/Plantar flexion intact Incision: dressings removed from B/L legs, incisions covered with steri-strips but appear C/D/I, minimal swelling and  bruising   Assessment:    S/P B/L Quadriceps Tendon Repair  by Dr. 5/14. 5/14 on 05/13/20  Principal Problem:   Patellar tendon rupture, unspecified laterality, sequela Active Problems:   CKD (chronic kidney disease) stage 2, GFR 60-89 ml/min   Essential hypertension   Type II diabetes mellitus (HCC)   Diabetes mellitus, new onset (HCC)   Hyponatremia   Leukocytosis   Plan: I removed the dressings this morning. His incisions look great.  Leave steri strips on incisions. Ok to get area wet while showering. Pat dry. Do not submerge legs in water.  ACE wrap PRN Up with therapy Incentive Spirometry Elevate and Apply ice PRN For some reason he is not currently on any VTE prophylaxis so I will start the ASA 81mg  bid that we normally use for patients after surgery  Weightbearing: WBAT B/L LE while in knee braces Insicional and dressing care: OK to remove dressings today and leave open to air with dry gauze PRN Orthopedic device(s): B/L Bledsoe knee braces locked in extension. Avoid knee flexion for 4 more weeks Showering: Use a shower chair, ok for water to run over incision sites, pat incisions dry, do not submerge legs in water VTE prophylaxis:  Aspirin 81mg  BID for 30 days after surgery, SCDs, ambulation Pain control: Continue current regimen. Ok if still needs narcotic right now Follow - up plan: 4 weeks in the office. Needs to call [phone] to make appointment Contact information:  [phone] MD, Margarita Rana PA-C  Dispo: Home once d/c from inpatient rehab. Will need either HHPT or OPPT once leaves hospital to continue working on mobilization. When he follows up in the office in 4 weeks, we will be at the 6 week post-op mark and start to advance his knee braces to slowly allow flexion.    Levester Fresh, PA-C Office [phone] 05/29/2020, 12:44 PM

## 2020-05-29 NOTE — Progress Notes (Addendum)
[facility] PHYSICAL MEDICINE & REHABILITATION PROGRESS NOTE  Subjective/Complaints: Patient's chart reviewed- No issues reported overnight Vitals signs stable Not requiring oxycodone- decrease to q12H PRN.  ROS: Denies CP, SOB, N/V/D  Objective: Vital Signs: Blood pressure 124/70, pulse 64, temperature 97.8 F (36.6 C), temperature source Oral, resp. rate 20, height 5\' 9"  (1.753 m), weight 99.8 kg, SpO2 99 %. No results found. Recent Labs    05/26/20 0628  WBC 12.9*  HGB 12.8*  HCT 38.2*  PLT 454*   Recent Labs    05/26/20 1132  NA 132*  K 4.1  CL 97*  CO2 28  GLUCOSE 123*  BUN 17  CREATININE 0.95  CALCIUM 9.0    Intake/Output Summary (Last 24 hours) at 05/29/2020 0617 Last data filed at 05/29/2020 0318 Gross per 24 hour  Intake 1218 ml  Output 925 ml  Net 293 ml        Physical Exam: BP 124/70 (BP Location: Left Arm)   Pulse 64   Temp 97.8 F (36.6 C) (Oral)   Resp 20   Ht 5\' 9"  (1.753 m)   Wt 99.8 kg   SpO2 99%   BMI 32.49 kg/m  Gen: no distress, normal appearing HEENT: oral mucosa pink and moist, NCAT Cardio: Reg rate Chest: normal effort, normal rate of breathing Abd: soft, non-distended Ext: no edema Psych: pleasant, normal affect Neuro: Alert Motor: B/l UE: 5/5 proximal to distal B/l LE: HF: 4/5, Knee limited due bracing, ADF 5/5 Skin: b/l lower extremities in braces, incisions with steri-strips  Assessment/Plan: 1. Functional deficits which require 3+ hours per day of interdisciplinary therapy in a comprehensive inpatient rehab setting.  Physiatrist is providing close team supervision and 24 hour management of active medical problems listed below.  Physiatrist and rehab team continue to assess barriers to discharge/monitor patient progress toward functional and medical goals   Care Tool:  Bathing    Body parts bathed by patient: Right arm,Left arm,Chest,Abdomen,Front perineal area,Buttocks,Right upper leg,Left upper leg,Face      Body parts n/a: Left lower leg,Right lower leg (covered by brace)   Bathing assist Assist Level: Contact Guard/Touching assist     Upper Body Dressing/Undressing Upper body dressing   What is the patient wearing?: Pull over shirt    Upper body assist Assist Level: Set up assist    Lower Body Dressing/Undressing Lower body dressing      What is the patient wearing?: Underwear/pull up,Pants     Lower body assist Assist for lower body dressing: Supervision/Verbal cueing     Toileting Toileting    Toileting assist Assist for toileting: Supervision/Verbal cueing Assistive Device Comment:  (urinal)   Transfers Chair/bed transfer  Transfers assist     Chair/bed transfer assist level: Supervision/Verbal cueing     Locomotion Ambulation   Ambulation assist      Assist level: Supervision/Verbal cueing Assistive device: Walker-rolling Max distance: 180'   Walk 10 feet activity   Assist     Assist level: Supervision/Verbal cueing Assistive device: Walker-rolling   Walk 50 feet activity   Assist    Assist level: Supervision/Verbal cueing Assistive device: Walker-rolling    Walk 150 feet activity   Assist Walk 150 feet activity did not occur: Safety/medical concerns  Assist level: Supervision/Verbal cueing Assistive device: Walker-rolling    Walk 10 feet on uneven surface  activity   Assist     Assist level: Supervision/Verbal cueing Assistive device: 05/31/2020 Will patient use  wheelchair at discharge?: Yes Type of Wheelchair: Manual    Wheelchair assist level: Supervision/Verbal cueing Max wheelchair distance: 300    Wheelchair 50 feet with 2 turns activity    Assist        Assist Level: Supervision/Verbal cueing   Wheelchair 150 feet activity     Assist      Assist Level: Supervision/Verbal cueing    Medical Problem List and Plan: 1.  B/L quad tendon repair secondary to tendon  ruptures B/L  secondary to a fall at work, with WBAT/no flexion of knees B/L  Continue CIR PT, OT  2.  Impaired mobility: -DVT/anticoagulation:  Pharmaceutical: d/c Lovenox since ambulating >250 feet             -antiplatelet therapy: N/A 3. Pain Management:  Continue ultram 50 mg qid scheduled w/Oxycodone prn -->effective- not usually taking prns. Decrease oxycodone to 10mg  q8H prn  4. Mood: LCSW to follow for evaluation and support.              -antipsychotic agents: N/A 5. Neuropsych: This patient is capable of making decisions on his own behalf. 6. B/l patellar incisions: healing well, continue to monitor.   7. Fluids/Electrolytes/Nutrition: Monitor I/Os 8. HTN: Monitor BP TID--continue irbesartan, well controlled.   Monitor with increased mobility 9. GERD: Continue Protonix. 10. Leucocytosis:   WBC trending down, repeat weekly.   Afebrile  Monitor for fevers and signs of infection. 11. New diagnosis T2DM: has been pre-diabetic. Now with Hgb A1C-7.1.     CBG (last 3)  Recent Labs    05/28/20 1713 05/28/20 2113 05/29/20 0607  GLUCAP 124* 113* 114*  slightly elevated to 113-124. Cr normal. Discussed with patient no sugar diet, avoiding bread/pasta/rice. D/c ISS.  12. ABLA:    Hemoglobin 12.7 on 4/8  Continue to monitor 13. Hyponatremia:   Sodium 134 on 4/8  Continue to monitor 14. Hypoalbuminemia:  Protein levels trending down.              added Ensure Max supplements with meals to promote healing.  15. Loose stools -  Decreased senokot S to 1 tab nightly from 2 tabs nightly  Now having BM qod  15.  Mild transaminitis  ALT mildly elevated on 4/8 16. Obesity BMI 32.49: provide dietary education    LOS: 7 days A FACE TO FACE EVALUATION WAS PERFORMED  6/8 Elvenia Godden 05/29/2020, 6:17 AM

## 2020-05-30 LAB — BASIC METABOLIC PANEL
Anion gap: 7 (ref 5–15)
BUN: 14 mg/dL (ref 6–20)
CO2: 26 mmol/L (ref 22–32)
Calcium: 8.9 mg/dL (ref 8.9–10.3)
Chloride: 99 mmol/L (ref 98–111)
Creatinine, Ser: 0.89 mg/dL (ref 0.61–1.24)
GFR, Estimated: >60 mL/min (ref >60)
Glucose, Bld: 121 mg/dL — ABNORMAL HIGH (ref 70–99)
Potassium: 4 mmol/L (ref 3.5–5.1)
Sodium: 132 mmol/L — ABNORMAL LOW (ref 135–145)

## 2020-05-30 LAB — GLUCOSE, CAPILLARY
Glucose-Capillary: 112 mg/dL — ABNORMAL HIGH (ref 70–99)
Glucose-Capillary: 108 mg/dL — ABNORMAL HIGH (ref 70–99)
Glucose-Capillary: 111 mg/dL — ABNORMAL HIGH (ref 70–99)
Glucose-Capillary: 136 mg/dL — ABNORMAL HIGH (ref 70–99)

## 2020-05-30 MED ORDER — OXYCODONE HCL 5 MG PO TABS
10.0000 mg | ORAL_TABLET | Freq: Every day | ORAL | Status: DC | PRN
Start: 2020-05-30 — End: 2020-05-31

## 2020-05-30 MED ORDER — IRBESARTAN 75 MG PO TABS
75.0000 mg | ORAL_TABLET | Freq: Every day | ORAL | Status: DC
  Administered 2020-05-31 – 2020-06-06 (×7): 75 mg via ORAL
  Filled 2020-05-30 (×7): qty 1

## 2020-05-30 NOTE — Progress Notes (Signed)
Physical Therapy Session Note  Patient Details  Name: Arthur Ayers MRN: [id number] Date of Birth: 09-23-1965  Today's Date: 05/30/2020 PT Individual Time:Session1: 3474-2595; Session2: 1304-1400 PT Individual Time Calculation (min): 60 min & 56 min  Short Term Goals: Week 1:  PT Short Term Goal 1 (Week 1): STG=LTG due to ELOS  Skilled Therapeutic Interventions/Progress Updates:    Session1: Patient in supine and reports has not washed or dressed yet.  Patient supine to sit with S and sit to stand S.  Obtained clothing from drawer in nightstand with S.  Ambulated to chair in front of sink and seated for upperbody dressing and hygiene at sink and lower body dressing with S.  Patient ambulated x 150' with S with RW and w/c close if needed.  Patient sit to supine on mat with S and performed therex with and added all to HEP.  Performed supine ankle pumps, SLR, hip abduction, quad sets, bridging over red therapy ball, standing hip abduction, extension, flexion, heel raises and Ant/Post rocking for limits of stability all with counter for support and all x 10 reps. Patient negotiated 4 steps with rails and S wide BOS and leading with L first with step to sequence and down with R first.  Patient performed stand pivot to w/c and propelled to room in w/c with S.  Patient stand step to recliner with RW.  Left in recliner with all needs in reach.  Discussed with OT after session, patient can be made Mod Indep in the room.   Session2: Patient in recliner and reports minimal pain.  Sit to stand with RW and S and ambulated 100' with S with RW.  Patient propelled w/c outside down elevator and over 200' with S.  Negotiated long concrete ramp ambulating with RW and close S x 40' down and 40' up.  Patient propelled back to therapy gym x 200' with S.  Standing balance activity tapping targets at BITS using memory sequencing for numbers up to 10 with S using 1 UE support.  Patient negotiated curb with RW and S after  demonstration of technique forward and backward, pt performing forward.  Patient ambulated back to room with RW and S.  Seated in recliner and ice applied to knees, left with all needs in reach.   Therapy Documentation Precautions:  Precautions Precautions: Fall,Knee Precaution Comments: no knee flexion ROM Required Braces or Orthoses: Knee Immobilizer - Right,Knee Immobilizer - Left Knee Immobilizer - Right: On at all times Knee Immobilizer - Left: On at all times Restrictions Weight Bearing Restrictions: No RLE Weight Bearing: Weight bearing as tolerated LLE Weight Bearing: Weight bearing as tolerated Pain: Pain Assessment Pain Score: 1    Therapy/Group: Individual Therapy  Elray Mcgregor  Sheran Lawless, PT 05/30/2020, 12:11 PM

## 2020-05-30 NOTE — Progress Notes (Signed)
Patient ID: Arthur Ayers, male   DOB: 11-14-1965, 55 y.o.   MRN: [id number]  Spoke with Cindi-RNCM Workers [employer] who reports still looking for a hospital bed for pt. Ramp adjuster approved yesterday unsure will be put up at pt's home. Pt was part of the conversation with Cindi, aware of this and probably will not be discharging home until Monday or Tuesday. Cindi made aware if he chose to go home with neighbors and friends helping him up the stairs and he fell workers comp would no longer cover him. Cindi to reach out to pt or wife later today regarding update.

## 2020-05-30 NOTE — Progress Notes (Signed)
Occupational Therapy Session Note  Patient Details  Name: KATE SWEETMAN MRN: [id number] Date of Birth: March 08, 1965  Today's Date: 05/30/2020 OT Individual Time: 0930-1030 OT Individual Time Calculation (min): 60 min    Short Term Goals: Week 1:  OT Short Term Goal 1 (Week 1): STG = LTG 2/2 ELOS Week 2:     Skilled Therapeutic Interventions/Progress Updates:    1:1 Pt in the recliner. Discussed options for showering and agreeable. Education providing on maintaining braces on and wrapping bilateral LEs with large garbage bags. Pt able to ambulate to and from the bathroom with RW mod I. Pt performed bathing with lateral leans for buttocks and periarea. Pt was able to reach down to feet to wash feet. A to don and removed bags from LEs. Pt able to dress only needing A for donning socks. Pt was made mod I in the room with RW, shoes and braces donned. Nursing made aware and sign put outside of room.   Therapy Documentation Precautions:  Precautions Precautions: Fall,Knee Precaution Comments: no knee flexion ROM Required Braces or Orthoses: Knee Immobilizer - Right,Knee Immobilizer - Left Knee Immobilizer - Right: On at all times Knee Immobilizer - Left: On at all times Restrictions Weight Bearing Restrictions: No RLE Weight Bearing: Weight bearing as tolerated LLE Weight Bearing: Weight bearing as tolerated Pain:  no c/o pain in session   Therapy/Group: Individual Therapy  Roney Mans [facility] 05/30/2020, 10:59 AM

## 2020-05-30 NOTE — Progress Notes (Signed)
[place] PHYSICAL MEDICINE & REHABILITATION PROGRESS NOTE  Subjective/Complaints: No complaints this morning. Ambulating well with therapy.  ROS: Denies CP, SOB, N/V/D  Objective: Vital Signs: Blood pressure 109/69, pulse 70, temperature 97.8 F (36.6 C), temperature source Oral, resp. rate 20, height 5\' 9"  (1.753 m), weight 99.8 kg, SpO2 95 %. No results found. No results for input(s): WBC, HGB, HCT, PLT in the last 72 hours. Recent Labs    05/30/20 0503  NA 132*  K 4.0  CL 99  CO2 26  GLUCOSE 121*  BUN 14  CREATININE 0.89  CALCIUM 8.9    Intake/Output Summary (Last 24 hours) at 05/30/2020 1218 Last data filed at 05/30/2020 0746 Gross per 24 hour  Intake 680 ml  Output 725 ml  Net -45 ml        Physical Exam: BP 109/69 (BP Location: Left Arm)   Pulse 70   Temp 97.8 F (36.6 C) (Oral)   Resp 20   Ht 5\' 9"  (1.753 m)   Wt 99.8 kg   SpO2 95%   BMI 32.49 kg/m  Gen: no distress, normal appearing HEENT: oral mucosa pink and moist, NCAT Cardio: Reg rate Chest: normal effort, normal rate of breathing Abd: soft, non-distended Ext: no edema Psych: pleasant, normal affect Skin: intact Neuro: Alert Motor: B/l UE: 5/5 proximal to distal B/l LE: HF: 4/5, Knee limited due bracing, ADF 5/5 Skin: b/l lower extremities in braces, incisions with steri-strips  Assessment/Plan: 1. Functional deficits which require 3+ hours per day of interdisciplinary therapy in a comprehensive inpatient rehab setting.  Physiatrist is providing close team supervision and 24 hour management of active medical problems listed below.  Physiatrist and rehab team continue to assess barriers to discharge/monitor patient progress toward functional and medical goals   Care Tool:  Bathing    Body parts bathed by patient: Right arm,Left arm,Chest,Abdomen,Front perineal area,Buttocks,Right upper leg,Left upper leg,Face     Body parts n/a: Left lower leg,Right lower leg (covered by brace)    Bathing assist Assist Level: Contact Guard/Touching assist     Upper Body Dressing/Undressing Upper body dressing   What is the patient wearing?: Pull over shirt    Upper body assist Assist Level: Set up assist    Lower Body Dressing/Undressing Lower body dressing      What is the patient wearing?: Underwear/pull up,Pants     Lower body assist Assist for lower body dressing: Supervision/Verbal cueing     Toileting Toileting    Toileting assist Assist for toileting: Supervision/Verbal cueing Assistive Device Comment:  (urinal)   Transfers Chair/bed transfer  Transfers assist     Chair/bed transfer assist level: Supervision/Verbal cueing     Locomotion Ambulation   Ambulation assist      Assist level: Supervision/Verbal cueing Assistive device: Walker-rolling Max distance: 300+   Walk 10 feet activity   Assist     Assist level: Supervision/Verbal cueing Assistive device: Walker-rolling   Walk 50 feet activity   Assist    Assist level: Supervision/Verbal cueing Assistive device: Walker-rolling    Walk 150 feet activity   Assist Walk 150 feet activity did not occur: Safety/medical concerns  Assist level: Supervision/Verbal cueing Assistive device: Walker-rolling    Walk 10 feet on uneven surface  activity   Assist     Assist level: Supervision/Verbal cueing Assistive device: 06/01/2020 Will patient use wheelchair at discharge?: Yes Type of Wheelchair: Manual    Wheelchair assist level: Supervision/Verbal  cueing Max wheelchair distance: 150    Wheelchair 50 feet with 2 turns activity    Assist        Assist Level: Supervision/Verbal cueing   Wheelchair 150 feet activity     Assist      Assist Level: Supervision/Verbal cueing    Medical Problem List and Plan: 1.  B/L quad tendon repair secondary to tendon ruptures B/L  secondary to a fall at work, with WBAT/no flexion of  knees B/L  Conitnue CIR PT, OT  2.  Impaired mobility: -DVT/anticoagulation:  Pharmaceutical: d/c Lovenox since ambulating >250 feet             -antiplatelet therapy: N/A 3. Pain Management:  Continue ultram 50 mg qid scheduled w/Oxycodone prn -->effective- not usually taking prns. Decrease oxycodone to 10mg  daily prn  4. Mood: LCSW to follow for evaluation and support.              -antipsychotic agents: N/A 5. Neuropsych: This patient is capable of making decisions on his own behalf. 6. B/l patellar incisions: healing well, continue to monitor.   7. Fluids/Electrolytes/Nutrition: Monitor I/Os 8. HTN: Monitor BP TID--decrease avapro to 75mg .   Monitor with increased mobility 9. GERD: Continue Protonix. 10. Leucocytosis:   WBC trending down, repeat weekly.   Afebrile  Monitor for fevers and signs of infection. 11. New diagnosis T2DM: has been pre-diabetic. Now with Hgb A1C-7.1.     CBG (last 3)  Recent Labs    05/29/20 2113 05/30/20 0614 05/30/20 1126  GLUCAP 155* 112* 108*  slightly elevated to 108-155 Cr normal. Discussed with patient no sugar diet, avoiding bread/pasta/rice/soft drinks. D/c ISS. D/c Ensure since contains sucralose.  12. ABLA:    Hemoglobin 12.7 on 4/8  Continue to monitor 13. Hyponatremia:   Sodium 134 on 4/8  Continue to monitor 14. Hypoalbuminemia:  Protein levels trending down. Encourage high protein foods.  15. Loose stools -  Decreased senokot S to 1 tab nightly from 2 tabs nightly  Now having BM qod  15.  Mild transaminitis  ALT mildly elevated on 4/8 16. Obesity BMI 32.49: provide dietary education    LOS: 8 days A FACE TO FACE EVALUATION WAS PERFORMED  6/8 Arthur Ayers 05/30/2020, 12:18 PM

## 2020-05-31 LAB — GLUCOSE, CAPILLARY
Glucose-Capillary: 108 mg/dL — ABNORMAL HIGH (ref 70–99)
Glucose-Capillary: 130 mg/dL — ABNORMAL HIGH (ref 70–99)
Glucose-Capillary: 98 mg/dL (ref 70–99)
Glucose-Capillary: 118 mg/dL — ABNORMAL HIGH (ref 70–99)

## 2020-05-31 NOTE — Progress Notes (Signed)
[place] PHYSICAL MEDICINE & REHABILITATION PROGRESS NOTE  Subjective/Complaints: No complaints this morning Discussed starting metformin for elevated CBGs- he prefers not to and to focus on losing weight Vitals stable  ROS: Denies CP, SOB, N/V/D  Objective: Vital Signs: Blood pressure 120/60, pulse 76, temperature 98 F (36.7 C), temperature source Oral, resp. rate 18, height 5\' 9"  (1.753 m), weight 99.8 kg, SpO2 96 %. No results found. No results for input(s): WBC, HGB, HCT, PLT in the last 72 hours. Recent Labs    05/30/20 0503  NA 132*  K 4.0  CL 99  CO2 26  GLUCOSE 121*  BUN 14  CREATININE 0.89  CALCIUM 8.9    Intake/Output Summary (Last 24 hours) at 05/31/2020 1228 Last data filed at 05/31/2020 0819 Gross per 24 hour  Intake 880 ml  Output --  Net 880 ml        Physical Exam: BP 120/60 (BP Location: Right Arm)   Pulse 76   Temp 98 F (36.7 C) (Oral)   Resp 18   Ht 5\' 9"  (1.753 m)   Wt 99.8 kg   SpO2 96%   BMI 32.49 kg/m  Gen: no distress, normal appearing HEENT: oral mucosa pink and moist, NCAT Cardio: Reg rate Chest: normal effort, normal rate of breathing Abd: soft, non-distended Ext: no edema Psych: pleasant, normal affect Skin: intact Neuro: Alert Motor: B/l UE: 5/5 proximal to distal B/l LE: HF: 4/5, Knee limited due bracing, ADF 5/5 Skin: b/l lower extremities in braces, incisions with steri-strips  Assessment/Plan: 1. Functional deficits which require 3+ hours per day of interdisciplinary therapy in a comprehensive inpatient rehab setting.  Physiatrist is providing close team supervision and 24 hour management of active medical problems listed below.  Physiatrist and rehab team continue to assess barriers to discharge/monitor patient progress toward functional and medical goals   Care Tool:  Bathing    Body parts bathed by patient: Right arm,Left arm,Chest,Abdomen,Front perineal area,Buttocks,Right upper leg,Left upper leg,Face      Body parts n/a: Left lower leg,Right lower leg (covered by brace)   Bathing assist Assist Level: Contact Guard/Touching assist     Upper Body Dressing/Undressing Upper body dressing   What is the patient wearing?: Pull over shirt    Upper body assist Assist Level: Set up assist    Lower Body Dressing/Undressing Lower body dressing      What is the patient wearing?: Underwear/pull up,Pants     Lower body assist Assist for lower body dressing: Supervision/Verbal cueing     Toileting Toileting    Toileting assist Assist for toileting: Supervision/Verbal cueing Assistive Device Comment:  (urinal)   Transfers Chair/bed transfer  Transfers assist     Chair/bed transfer assist level: Supervision/Verbal cueing     Locomotion Ambulation   Ambulation assist      Assist level: Supervision/Verbal cueing Assistive device: Walker-rolling Max distance: 300+   Walk 10 feet activity   Assist     Assist level: Supervision/Verbal cueing Assistive device: Walker-rolling   Walk 50 feet activity   Assist    Assist level: Supervision/Verbal cueing Assistive device: Walker-rolling    Walk 150 feet activity   Assist Walk 150 feet activity did not occur: Safety/medical concerns  Assist level: Supervision/Verbal cueing Assistive device: Walker-rolling    Walk 10 feet on uneven surface  activity   Assist     Assist level: Supervision/Verbal cueing Assistive device: 06/02/2020 Will patient use wheelchair at  discharge?: Yes Type of Wheelchair: Manual    Wheelchair assist level: Supervision/Verbal cueing Max wheelchair distance: 150    Wheelchair 50 feet with 2 turns activity    Assist        Assist Level: Supervision/Verbal cueing   Wheelchair 150 feet activity     Assist      Assist Level: Supervision/Verbal cueing    Medical Problem List and Plan: 1.  B/L quad tendon repair secondary to  tendon ruptures B/L  secondary to a fall at work, with WBAT/no flexion of knees B/L  Continue CIR PT, OT  2.  Impaired mobility: -DVT/anticoagulation:  Pharmaceutical: d/c Lovenox since ambulating >250 feet             -antiplatelet therapy: N/A 3. Pain Management:  Continue ultram 50 mg qid scheduled, d/c oxycodone.  4. Mood: LCSW to follow for evaluation and support.              -antipsychotic agents: N/A 5. Neuropsych: This patient is capable of making decisions on his own behalf. 6. B/l patellar incisions: healing well, continue to monitor.   7. Fluids/Electrolytes/Nutrition: Monitor I/Os 8. HTN: Monitor BP TID--decrease avapro to 75mg .   Monitor with increased mobility 9. GERD: Continue Protonix. 10. Leucocytosis:   WBC trending down, repeat weekly.   Afebrile  Monitor for fevers and signs of infection. 11. New diagnosis T2DM: has been pre-diabetic. Now with Hgb A1C-7.1.     CBG (last 3)  Recent Labs    05/30/20 2121 05/31/20 0616 05/31/20 1145  GLUCAP 136* 108* 130*  slightly elevated to 108-155 Cr normal. Discussed with patient no sugar diet, avoiding bread/pasta/rice/soft drinks. Discussed metformin, but he prefers to focus on lifestyle changes. D/c ISS. D/c Ensure since contains sucralose.  12. ABLA:    Hemoglobin 12.7 on 4/8  Continue to monitor 13. Hyponatremia:   Sodium 134 on 4/8  Continue to monitor 14. Hypoalbuminemia:  Protein levels trending down. Encourage high protein foods.  15. Loose stools -  Decreased senokot S to 1 tab nightly from 2 tabs nightly  Now having BM qod  15.  Mild transaminitis  ALT mildly elevated on 4/8 16. Obesity BMI 32.49: continue dietary education    LOS: 9 days A FACE TO FACE EVALUATION WAS PERFORMED  6/8 Lovinia Snare 05/31/2020, 12:28 PM

## 2020-06-01 LAB — GLUCOSE, CAPILLARY
Glucose-Capillary: 129 mg/dL — ABNORMAL HIGH (ref 70–99)
Glucose-Capillary: 96 mg/dL (ref 70–99)
Glucose-Capillary: 124 mg/dL — ABNORMAL HIGH (ref 70–99)
Glucose-Capillary: 132 mg/dL — ABNORMAL HIGH (ref 70–99)

## 2020-06-01 MED ORDER — TRAMADOL HCL 50 MG PO TABS
50.0000 mg | ORAL_TABLET | Freq: Three times a day (TID) | ORAL | Status: DC
  Administered 2020-06-01 – 2020-06-06 (×13): 50 mg via ORAL
  Filled 2020-06-01 (×14): qty 1

## 2020-06-01 NOTE — Progress Notes (Signed)
[place] PHYSICAL MEDICINE & REHABILITATION PROGRESS NOTE  Subjective/Complaints: No complaints this morning. Denies pain, constipation, insomnia.    ROS: Denies CP, SOB, N/V/D  Objective: Vital Signs: Blood pressure 123/70, pulse 66, temperature 98 F (36.7 C), temperature source Oral, resp. rate 18, height 5\' 9"  (1.753 m), weight 99.8 kg, SpO2 100 %. No results found. No results for input(s): WBC, HGB, HCT, PLT in the last 72 hours. Recent Labs    05/30/20 0503  NA 132*  K 4.0  CL 99  CO2 26  GLUCOSE 121*  BUN 14  CREATININE 0.89  CALCIUM 8.9    Intake/Output Summary (Last 24 hours) at 06/01/2020 1400 Last data filed at 06/01/2020 0800 Gross per 24 hour  Intake 720 ml  Output 400 ml  Net 320 ml        Physical Exam: BP 123/70 (BP Location: Left Arm)   Pulse 66   Temp 98 F (36.7 C) (Oral)   Resp 18   Ht 5\' 9"  (1.753 m)   Wt 99.8 kg   SpO2 100%   BMI 32.49 kg/m  Gen: no distress, normal appearing HEENT: oral mucosa pink and moist, NCAT Cardio: Reg rate Chest: normal effort, normal rate of breathing Abd: soft, non-distended Ext: no edema Psych: pleasant, normal affect Skin: intact Neuro: Alert Motor: B/l UE: 5/5 proximal to distal B/l LE: HF: 4/5, Knee limited due bracing, ADF 5/5 Skin: b/l lower extremities in braces, incisions with steri-strips  Assessment/Plan: 1. Functional deficits which require 3+ hours per day of interdisciplinary therapy in a comprehensive inpatient rehab setting.  Physiatrist is providing close team supervision and 24 hour management of active medical problems listed below.  Physiatrist and rehab team continue to assess barriers to discharge/monitor patient progress toward functional and medical goals   Care Tool:  Bathing    Body parts bathed by patient: Right arm,Left arm,Chest,Abdomen,Front perineal area,Buttocks,Right upper leg,Left upper leg,Face     Body parts n/a: Left lower leg,Right lower leg (covered by  brace)   Bathing assist Assist Level: Contact Guard/Touching assist     Upper Body Dressing/Undressing Upper body dressing   What is the patient wearing?: Pull over shirt    Upper body assist Assist Level: Set up assist    Lower Body Dressing/Undressing Lower body dressing      What is the patient wearing?: Underwear/pull up,Pants     Lower body assist Assist for lower body dressing: Supervision/Verbal cueing     Toileting Toileting    Toileting assist Assist for toileting: Supervision/Verbal cueing Assistive Device Comment:  (urinal)   Transfers Chair/bed transfer  Transfers assist     Chair/bed transfer assist level: Supervision/Verbal cueing     Locomotion Ambulation   Ambulation assist      Assist level: Supervision/Verbal cueing Assistive device: Walker-rolling Max distance: 300+   Walk 10 feet activity   Assist     Assist level: Supervision/Verbal cueing Assistive device: Walker-rolling   Walk 50 feet activity   Assist    Assist level: Supervision/Verbal cueing Assistive device: Walker-rolling    Walk 150 feet activity   Assist Walk 150 feet activity did not occur: Safety/medical concerns  Assist level: Supervision/Verbal cueing Assistive device: Walker-rolling    Walk 10 feet on uneven surface  activity   Assist     Assist level: Supervision/Verbal cueing Assistive device: 06/03/2020 Will patient use wheelchair at discharge?: Yes Type of Wheelchair: Manual    Wheelchair assist  level: Supervision/Verbal cueing Max wheelchair distance: 150    Wheelchair 50 feet with 2 turns activity    Assist        Assist Level: Supervision/Verbal cueing   Wheelchair 150 feet activity     Assist      Assist Level: Supervision/Verbal cueing    Medical Problem List and Plan: 1.  B/L quad tendon repair secondary to tendon ruptures B/L  secondary to a fall at work, with WBAT/no  flexion of knees B/L  Continue CIR PT, OT  2.  Impaired mobility: -DVT/anticoagulation:  Pharmaceutical: d/c Lovenox since ambulating >250 feet             -antiplatelet therapy: N/A 3. Pain Management:  Decrease ultram to 50 mg TID scheduled, d/c oxycodone.  4. Mood: LCSW to follow for evaluation and support.              -antipsychotic agents: N/A 5. Neuropsych: This patient is capable of making decisions on his own behalf. 6. B/l patellar incisions: healing well, continue to monitor.   7. Fluids/Electrolytes/Nutrition: Monitor I/Os 8. HTN: Monitor BP TID--decrease avapro to 75mg , stable with decrease, continue  Monitor with increased mobility 9. GERD: Continue Protonix. 10. Leucocytosis:   WBC trending down, repeat weekly.   Afebrile  Monitor for fevers and signs of infection. 11. New diagnosis T2DM: has been pre-diabetic. Now with Hgb A1C-7.1.     CBG (last 3)  Recent Labs    05/31/20 2204 06/01/20 0613 06/01/20 1126  GLUCAP 118* 129* 96  slightly elevated to 96-129, improved. Cr normal. Discussed with patient no sugar diet, avoiding bread/pasta/rice/soft drinks. Discussed metformin, but he prefers to focus on lifestyle changes. D/c ISS. D/c Ensure since contains sucralose.  12. ABLA:    Hemoglobin 12.7 on 4/8  Continue to monitor 13. Hyponatremia:   Sodium 134 on 4/8  Continue to monitor 14. Hypoalbuminemia:  Protein levels trending down. Encourage high protein foods.  15. Loose stools -  Decreased senokot S to 1 tab nightly from 2 tabs nightly  Now having BM qod  15.  Mild transaminitis  ALT mildly elevated on 4/8 16. Obesity BMI 32.49: continue dietary education    LOS: 10 days A FACE TO FACE EVALUATION WAS PERFORMED  Taytum Wheller P Madisson Kulaga 06/01/2020, 2:00 PM

## 2020-06-02 LAB — GLUCOSE, CAPILLARY
Glucose-Capillary: 103 mg/dL — ABNORMAL HIGH (ref 70–99)
Glucose-Capillary: 100 mg/dL — ABNORMAL HIGH (ref 70–99)
Glucose-Capillary: 86 mg/dL (ref 70–99)
Glucose-Capillary: 111 mg/dL — ABNORMAL HIGH (ref 70–99)

## 2020-06-02 NOTE — Progress Notes (Addendum)
Patient ID: Arthur Ayers, male   DOB: October 02, 1965, 55 y.o.   MRN: [id number]  Message left for [employer] CM regarding status of portable ramp and hospital bed both pt needs to safely discharge home. Await return call.  3:00 PM Spoke with [employer] who reported ramp to go out today and still not sure regarding hospital bed. Check with tomorrow

## 2020-06-02 NOTE — Progress Notes (Signed)
[place] PHYSICAL MEDICINE & REHABILITATION PROGRESS NOTE  Subjective/Complaints:  No issues overnight   ROS: Denies CP, SOB, N/V/D  Objective: Vital Signs: Blood pressure 112/72, pulse 74, temperature 97.9 F (36.6 C), temperature source Oral, resp. rate 18, height 5\' 9"  (1.753 m), weight 99.8 kg, SpO2 99 %. No results found. No results for input(s): WBC, HGB, HCT, PLT in the last 72 hours. No results for input(s): NA, K, CL, CO2, GLUCOSE, BUN, CREATININE, CALCIUM in the last 72 hours.  Intake/Output Summary (Last 24 hours) at 06/02/2020 0746 Last data filed at 06/02/2020 0735 Gross per 24 hour  Intake 840 ml  Output --  Net 840 ml        Physical Exam: BP 112/72 (BP Location: Left Arm)   Pulse 74   Temp 97.9 F (36.6 C) (Oral)   Resp 18   Ht 5\' 9"  (1.753 m)   Wt 99.8 kg   SpO2 99%   BMI 32.49 kg/m   General: No acute distress Mood and affect are appropriate Heart: Regular rate and rhythm no rubs murmurs or extra sounds Lungs: Clear to auscultation, breathing unlabored, no rales or wheezes Abdomen: Positive bowel sounds, soft nontender to palpation, nondistended Extremities: No clubbing, cyanosis, or edema   Motor: B/l UE: 5/5 proximal to distal B/l LE: HF: 4/5, Knee limited due bracing, ADF 5/5 Skin: b/l lower extremities in braces, incisions with steri-strips  Assessment/Plan: 1. Functional deficits which require 3+ hours per day of interdisciplinary therapy in a comprehensive inpatient rehab setting.  Physiatrist is providing close team supervision and 24 hour management of active medical problems listed below.  Physiatrist and rehab team continue to assess barriers to discharge/monitor patient progress toward functional and medical goals   Care Tool:  Bathing    Body parts bathed by patient: Right arm,Left arm,Chest,Abdomen,Front perineal area,Buttocks,Right upper leg,Left upper leg,Face     Body parts n/a: Left lower leg,Right lower leg (covered  by brace)   Bathing assist Assist Level: Contact Guard/Touching assist     Upper Body Dressing/Undressing Upper body dressing   What is the patient wearing?: Pull over shirt    Upper body assist Assist Level: Set up assist    Lower Body Dressing/Undressing Lower body dressing      What is the patient wearing?: Underwear/pull up,Pants     Lower body assist Assist for lower body dressing: Supervision/Verbal cueing     Toileting Toileting    Toileting assist Assist for toileting: Supervision/Verbal cueing Assistive Device Comment:  (urinal)   Transfers Chair/bed transfer  Transfers assist     Chair/bed transfer assist level: Supervision/Verbal cueing     Locomotion Ambulation   Ambulation assist      Assist level: Supervision/Verbal cueing Assistive device: Walker-rolling Max distance: 300+   Walk 10 feet activity   Assist     Assist level: Supervision/Verbal cueing Assistive device: Walker-rolling   Walk 50 feet activity   Assist    Assist level: Supervision/Verbal cueing Assistive device: Walker-rolling    Walk 150 feet activity   Assist Walk 150 feet activity did not occur: Safety/medical concerns  Assist level: Supervision/Verbal cueing Assistive device: Walker-rolling    Walk 10 feet on uneven surface  activity   Assist     Assist level: Supervision/Verbal cueing Assistive device: 06/04/2020 Will patient use wheelchair at discharge?: Yes Type of Wheelchair: Manual    Wheelchair assist level: Supervision/Verbal cueing Max wheelchair distance: 150  Wheelchair 50 feet with 2 turns activity    Assist        Assist Level: Supervision/Verbal cueing   Wheelchair 150 feet activity     Assist      Assist Level: Supervision/Verbal cueing    Medical Problem List and Plan: 1.  B/L quad tendon repair secondary to tendon ruptures B/L  secondary to a fall at work, with WBAT/no  flexion of knees B/L  Continue CIR PT, OT , awaiting ramp  2.  Impaired mobility: -DVT/anticoagulation:  Pharmaceutical: d/c Lovenox since ambulating >250 feet             -antiplatelet therapy: N/A 3. Pain Management:  Decrease ultram to 50 mg TID scheduled, d/c oxycodone.  4. Mood: LCSW to follow for evaluation and support.              -antipsychotic agents: N/A 5. Neuropsych: This patient is capable of making decisions on his own behalf. 6. B/l patellar incisions: healing well, continue to monitor.   7. Fluids/Electrolytes/Nutrition: Monitor I/Os 8. HTN: Monitor BP TID--decrease avapro to 75mg , stable with decrease, continue  Monitor with increased mobility 9. GERD: Continue Protonix. 10. Leucocytosis:   WBC trending down, repeat weekly.   Afebrile  Monitor for fevers and signs of infection. 11. New diagnosis T2DM: has been pre-diabetic. Now with Hgb A1C-7.1.     CBG (last 3)  Recent Labs    06/01/20 1645 06/01/20 2127 06/02/20 0634  GLUCAP 124* 132* 103*  controlled  12. ABLA:    Hemoglobin 12.7 on 4/8  Continue to monitor 13. Hyponatremia:   Sodium 134 on 4/8  Continue to monitor 14. Hypoalbuminemia:  Protein levels trending down. Encourage high protein foods.  15. Loose stools -  Decreased senokot S to 1 tab nightly from 2 tabs nightly  Had loose BM yesterday , will monitor may need to reduce senna S 15.  Mild transaminitis  ALT mildly elevated on 4/8 16. Obesity BMI 32.49: continue dietary education    LOS: 11 days A FACE TO FACE EVALUATION WAS PERFORMED  6/8 06/02/2020, 7:46 AM

## 2020-06-02 NOTE — Progress Notes (Signed)
Physical Therapy Session Note  Patient Details  Name: Arthur Ayers MRN: [id number] Date of Birth: [birth year]  Today's Date: 06/02/2020 PT Individual Time:Session1: 1105-1200; [name]: 9476-5465 PT Individual Time Calculation (min): 55 min & 45 min  Short Term Goals: Week 1:  PT Short Term Goal 1 (Week 1): STG=LTG due to ELOS  Skilled Therapeutic Interventions/Progress Updates:    Session1:  Patient in recliner and reports unsure if can get ramp today due to rain.  Patient performed sit to stand mod I and ambulated to ortho gym x 150' with S.  Performed car transfer with S to 31" height simulating to back seat with S.  Patient performed sit<>supine mod I and supine for strength assessment.  Performed wheelchair mobility to dayroom x 200' with S.  Ambulated through obstacle course with RW and S around cones and over sticks.  TUG for balance assessment with RW in 20.5 sec and TUG cognitive counting backwards by 3's from 60 with RW in 24.2 sec.  Patient ambulated to room x 200' with RW and S.  Performed standing therex as noted below while holding footboard on bed.  Ice to knees while in recliner and all needs in reach.  Session2:  Patient in recliner and discussed w/c for home to use if needed, but if not used okay to call for return.  Patient sit to stand mod I and ambulated x 170' to general gym, negotiated 4 steps with rails and close S wide BOS and cues to avoid bending on descent.  Patient negotiated curb step with RW and S.  In parallel bars performed standing balance activities with minimal UE support tapping one or alternating feet to numbered discs on floor single then double then triple taps.  Ambulated forward in floor ladder in bars, then with RW for stride length, step width and foot clearance.  Side stepping through floor ladder in parallel bars.  Patient ambulated to room and left with ice on knees and all needs in reach.  Therapy Documentation Precautions:   Precautions Precautions: Fall,Knee Precaution Comments: no knee flexion ROM Required Braces or Orthoses: Knee Immobilizer - Right,Knee Immobilizer - Left Knee Immobilizer - Right: On at all times Knee Immobilizer - Left: On at all times Restrictions Weight Bearing Restrictions: No RLE Weight Bearing: Weight bearing as tolerated LLE Weight Bearing: Weight bearing as tolerated Pain: Pain Assessment Pain Score: 0-No pain  Exercises: General Exercises - Lower Extremity Hip ABduction/ADduction: Both;10 reps;Strengthening;Standing Hip Flexion/Marching: Strengthening;Both;10 reps;Standing Heel Raises: Strengthening;Both;10 reps;Standing Other Exercises Other Exercises: standing hip extension x 10 Other Exercises: standing ant/post rocking with UE support for balance Other Exercises: standing partial tandem stand 10 sec each foot position   Therapy/Group: Individual Therapy  Elray Mcgregor  Sheran Lawless, PT 06/02/2020, 1:39 PM

## 2020-06-02 NOTE — Progress Notes (Signed)
Inpatient Rehabilitation Care Coordinator Discharge Note  The overall goal for the admission was met for:   Discharge location: Yes-HOME WITH WIFE WHO PLANS ON WORKING FROM HOME UNTIL MOD/I  Length of Stay: No-15 days-was ready 4/14 but equipment not available and ramp not in place. Avoidable delay of 8 days  Discharge activity level: Yes-SUPERVISION LEVEL  Home/community participation: Yes  Services provided included: MD, RD, PT, OT, RN, CM, Pharmacy, Neuropsych and SW  Financial Services: Worker's Comp  Choices offered to/list presented to:YES  Follow-up services arranged: Outpatient: [employer] [facility] OPPT, DME: [employer] [facility]-HOSPITAL BED, WHEELCHAIR BARIATRIC ROLLING WALKER,  3IN 1 AND HIP KIT and Patient/Family has no preference for HH/DME agencies PORTABLE RAMP  Comments (or additional information):DIFFICULTY LOCATING HOSPITAL BED EXTENDED PT'S STAY ON CIR. HAVE SKED FOR AMBULANCE TRANSFER TO GET HOME SO DOES NOT NEED TO WAIT UNTIL Monday FOR RAMP TO BE PLACED. ADJUSTER APPROVED THIS.PTAR SET UP FOR 12;00 PICKUP  Patient/Family verbalized understanding of follow-up arrangements: Yes  Individual responsible for coordination of the follow-up plan: SHERRI-WIFE [phone]  Confirmed correct DME delivered: Dupree, Rebecca G 06/02/2020    Dupree, Rebecca G 

## 2020-06-02 NOTE — Progress Notes (Signed)
Physical Therapy Discharge Summary  Patient Details  Name: Arthur Ayers MRN: [id number] Date of Birth: Mar 27, 1965  Patient has met 11 of 11 long term goals due to improved activity tolerance, improved balance, increased strength, decreased pain and ability to compensate for deficits.  Patient to discharge at an ambulatory level Modified Independent.   Patient's care partner is independent to provide the necessary physical assistance at discharge.  Reasons goals not met: Goals met  Recommendation:  Patient will benefit from ongoing skilled PT services in home health setting to continue to advance safe functional mobility, address ongoing impairments in safety in home environment, and minimize fall risk.  Feel outpatient follow up needed when pt allowed to bend his knees.  Equipment: bariatric RW needed due to knee extension braces causing wide BOS  Reasons for discharge: treatment goals met and discharge from hospital  Patient/family agrees with progress made and goals achieved: Yes  PT Discharge Precautions/Restrictions Precautions Precautions: Fall;Knee Precaution Comments: no knee flexion ROM Required Braces or Orthoses: Knee Immobilizer - Right;Knee Immobilizer - Left Knee Immobilizer - Right: On at all times Knee Immobilizer - Left: On at all times Restrictions RLE Weight Bearing: Weight bearing as tolerated LLE Weight Bearing: Weight bearing as tolerated Pain Pain Assessment Pain Score: 0-No pain Vision/Perception  Perception Perception: Within Functional Limits Praxis Praxis: Intact  Cognition Overall Cognitive Status: Within Functional Limits for tasks assessed Arousal/Alertness: Awake/alert Orientation Level: Oriented X4 Safety/Judgment: Appears intact Sensation Sensation Light Touch: Appears Intact Hot/Cold: Not tested Proprioception: Appears Intact Stereognosis: Not tested Coordination Gross Motor Movements are Fluid and Coordinated: No Fine Motor  Movements are Fluid and Coordinated: Yes Coordination and Movement Description: Limited by knee flexion restrictions and wearing braces at all times Motor  Motor Motor: Within Functional Limits Motor - Discharge Observations: progressing LE strength and balance  Mobility Bed Mobility Bed Mobility: Rolling Right;Rolling Left Rolling Right: Independent Rolling Left: Independent Supine to Sit: Independent Transfers Sit to Stand: Independent with assistive device Stand Pivot Transfers: Independent with assistive device Transfer (Assistive device): Rolling walker Locomotion  Gait Gait Assistance: Supervision/Verbal cueing Gait Distance (Feet): 150 Feet Assistive device: Rolling walker  Trunk/Postural Assessment  Cervical Assessment Cervical Assessment: Within Functional Limits Thoracic Assessment Thoracic Assessment: Exceptions to [facility] (rounded shoulders) Lumbar Assessment Lumbar Assessment: Within Functional Limits Postural Control Postural Control: Within Functional Limits  Balance Balance Balance Assessed: Yes Standardized Balance Assessment Standardized Balance Assessment: Timed Up and Go Test Timed Up and Go Test TUG: Normal TUG;Cognitive TUG Normal TUG (seconds): 20.5 Cognitive TUG (seconds): 24.2 Static Sitting Balance Static Sitting - Level of Assistance: 7: Independent Dynamic Sitting Balance Dynamic Sitting - Level of Assistance: 7: Independent Static Standing Balance Static Standing - Balance Support: Right upper extremity supported;Left upper extremity supported Static Standing - Level of Assistance: 6: Modified independent (Device/Increase time) Dynamic Standing Balance Dynamic Standing - Balance Support: Right upper extremity supported;Left upper extremity supported Dynamic Standing - Level of Assistance: 6: Modified independent (Device/Increase time) Extremity Assessment      RLE Assessment Active Range of Motion (AROM) Comments: ankle and hip WFL, knee  NT due to immobilizer General Strength Comments: hip flexion, abduction, extension 4+/5, ankle DF 5/5 LLE Assessment Active Range of Motion (AROM) Comments: ankle, hip and knee WFL, knee NT due to immobilizer General Strength Comments: hip flexion, abduction, extension 4+/5, ankle DF 5/5    [employer], PT 06/02/2020, 1:18 PM  06/06/2020

## 2020-06-02 NOTE — Progress Notes (Signed)
Occupational Therapy Session Note  Patient Details  Name: LASHAWN ORREGO MRN: [id number] Date of Birth: [birth year]  Today's Date: 06/02/2020 OT Individual Time: 8366-2947 OT Individual Time Calculation (min): 57 min    Short Term Goals: Week 1:  OT Short Term Goal 1 (Week 1): STG = LTG 2/2 ELOS  Skilled Therapeutic Interventions/Progress Updates:    Patient seated in bed, ready for therapy session.  He performs mod I functional transfers to/from bed, recliner and shower bench.  Requires set up for shower - securing bags over bilateral legs/knee braces.  Completed shower seated on shower bench with grab bars, hand held shower and long handled sponge.  Dressing completed seated on arm chair mod I with exception of slipper socks min A due to damp feet.   He is able to gather all supplies mod I with RW.  Completes grooming tasks at sink in stance with RW mod I.  He returned to recliner at close of session all needs within reach.    Therapy Documentation Precautions:  Precautions Precautions: Fall,Knee Precaution Comments: no knee flexion ROM Required Braces or Orthoses: Knee Immobilizer - Right,Knee Immobilizer - Left Knee Immobilizer - Right: On at all times Knee Immobilizer - Left: On at all times Restrictions Weight Bearing Restrictions: No RLE Weight Bearing: Weight bearing as tolerated LLE Weight Bearing: Weight bearing as tolerated   Therapy/Group: Individual Therapy  [name] 06/02/2020, 7:30 AM

## 2020-06-03 LAB — GLUCOSE, CAPILLARY
Glucose-Capillary: 106 mg/dL — ABNORMAL HIGH (ref 70–99)
Glucose-Capillary: 98 mg/dL (ref 70–99)
Glucose-Capillary: 90 mg/dL (ref 70–99)
Glucose-Capillary: 123 mg/dL — ABNORMAL HIGH (ref 70–99)

## 2020-06-03 NOTE — Progress Notes (Signed)
Patient ID: Arthur Ayers, male   DOB: [birth year], 55 y.o.   MRN: [id number]  Spoke with pt and Arthur Ayers  Comp CM ramp has not been delivered yet and still trying to locate hospital bed-now will go out of network to find one which is in [place]. Let team know when updated.

## 2020-06-03 NOTE — Progress Notes (Signed)
[place] PHYSICAL MEDICINE & REHABILITATION PROGRESS NOTE  Subjective/Complaints: Patient seen ambulating with rolling walker in his room this morning.  He states he slept well overnight.  He is awaiting ramp placement.  He states he has not had a bowel movement in a couple of days, but does not want to change any medications at present.  ROS: Denies CP, SOB, N/V/D  Objective: Vital Signs: Blood pressure 112/68, pulse (!) 57, temperature 97.8 F (36.6 C), temperature source Oral, resp. rate 20, height 5\' 9"  (1.753 m), weight 99.8 kg, SpO2 100 %. No results found. No results for input(s): WBC, HGB, HCT, PLT in the last 72 hours. No results for input(s): NA, K, CL, CO2, GLUCOSE, BUN, CREATININE, CALCIUM in the last 72 hours.  Intake/Output Summary (Last 24 hours) at 06/03/2020 0838 Last data filed at 06/03/2020 0737 Gross per 24 hour  Intake 1220 ml  Output --  Net 1220 ml        Physical Exam: BP 112/68 (BP Location: Left Arm)   Pulse (!) 57   Temp 97.8 F (36.6 C) (Oral)   Resp 20   Ht 5\' 9"  (1.753 m)   Wt 99.8 kg   SpO2 100%   BMI 32.49 kg/m  Constitutional: No distress . Vital signs reviewed. HENT: Normocephalic.  Atraumatic. Eyes: EOMI. No discharge. Cardiovascular: No JVD.  RRR. Respiratory: Normal effort.  No stridor.  Bilateral clear to auscultation. GI: Non-distended.  BS +. Skin: Warm and dry.  Intact. Psych: Normal mood.  Normal behavior. Musc: No edema in extremities.  No tenderness in extremities. Neuro: Alert Motor: 5/5 proximal to distal B/l LE: HF: 4/5, Knee limited due bracing, ADF 5/5, improving  Assessment/Plan: 1. Functional deficits which require 3+ hours per day of interdisciplinary therapy in a comprehensive inpatient rehab setting.  Physiatrist is providing close team supervision and 24 hour management of active medical problems listed below.  Physiatrist and rehab team continue to assess barriers to discharge/monitor patient progress  toward functional and medical goals   Care Tool:  Bathing    Body parts bathed by patient: Right arm,Left arm,Chest,Abdomen,Front perineal area,Buttocks,Right upper leg,Left upper leg,Face,Right lower leg,Left lower leg     Body parts n/a: Left lower leg,Right lower leg (covered by brace)   Bathing assist Assist Level: Set up assist     Upper Body Dressing/Undressing Upper body dressing   What is the patient wearing?: Pull over shirt    Upper body assist Assist Level: Independent    Lower Body Dressing/Undressing Lower body dressing      What is the patient wearing?: Underwear/pull up,Pants     Lower body assist Assist for lower body dressing: Independent     Toileting Toileting    Toileting assist Assist for toileting: Independent with assistive device Assistive Device Comment:  (urinal)   Transfers Chair/bed transfer  Transfers assist     Chair/bed transfer assist level: Independent with assistive device Chair/bed transfer assistive device: Walker,Armrests   Locomotion Ambulation   Ambulation assist      Assist level: Supervision/Verbal cueing Assistive device: Walker-rolling Max distance: 200'   Walk 10 feet activity   Assist     Assist level: Independent with assistive device Assistive device: Walker-rolling,Orthosis   Walk 50 feet activity   Assist    Assist level: Independent with assistive device Assistive device: Walker-rolling,Orthosis    Walk 150 feet activity   Assist Walk 150 feet activity did not occur: Safety/medical concerns  Assist level: Supervision/Verbal cueing Assistive device:  Walker-rolling,Orthosis    Walk 10 feet on uneven surface  activity   Assist     Assist level: Supervision/Verbal cueing Assistive device: Walker-rolling,Orthosis   Wheelchair     Assist Will patient use wheelchair at discharge?: Yes Type of Wheelchair: Manual    Wheelchair assist level: Supervision/Verbal cueing Max  wheelchair distance: 150    Wheelchair 50 feet with 2 turns activity    Assist        Assist Level: Supervision/Verbal cueing   Wheelchair 150 feet activity     Assist      Assist Level: Supervision/Verbal cueing    Medical Problem List and Plan: 1.  B/L quad tendon repair secondary to tendon ruptures B/L  secondary to a fall at work, with WBAT/no flexion of knees B/L  Continue CIR, waiting ramp 2.  Impaired mobility: -DVT/anticoagulation:  Pharmaceutical: d/c Lovenox since ambulating >250 feet             -antiplatelet therapy: N/A 3. Pain Management:  Decrease ultram to 50 mg TID scheduled, d/ced oxycodone.   Controlled on 4/19 4. Mood: LCSW to follow for evaluation and support.              -antipsychotic agents: N/A 5. Neuropsych: This patient is capable of making decisions on his own behalf. 6. B/l patellar incisions: healing well, continue to monitor.   7. Fluids/Electrolytes/Nutrition: Monitor I/Os 8. HTN: Monitor BP TID--decreased avapro to 75mg   Control on 4/19  Monitor with increased mobility 9. GERD: Continue Protonix. 10. Leucocytosis:   WBC trending down, labs ordered for tomorrow  Afebrile  Monitor for fevers and signs of infection. 11. New diagnosis T2DM: has been pre-diabetic. Now with Hgb A1C-7.1.     CBG (last 3)  Recent Labs    06/02/20 1700 06/02/20 2104 06/03/20 0622  GLUCAP 86 111* 106*   Mildly elevated on 4/19 12. ABLA:    Hemoglobin 12.7 on 4/8  Continue to monitor 13. Hyponatremia:   Sodium 132 on 4/15  Continue to monitor 14. Hypoalbuminemia:  Protein levels trending down. Encourage high protein foods.  15.  Bowels-  Initially having loose stools, so decreased senokot S to 1 tab nightly from 2 tabs nightly  Has not had a bowel movement in 2 days, but states that he feels like he will have to go soon and does not want to change any medication. 15.  Mild transaminitis  ALT mildly elevated on 4/8 16. Obesity BMI 32.49:  continue dietary education    LOS: 12 days A FACE TO FACE EVALUATION WAS PERFORMED  Belynda Pagaduan 6/8 06/03/2020, 8:38 AM

## 2020-06-03 NOTE — Progress Notes (Signed)
Occupational Therapy Discharge Summary  Patient Details  Name: Arthur Ayers MRN: [id number] Date of Birth: 1966-01-12     Patient has met 10 of 10 long term goals due to improved activity tolerance, improved balance, postural control, ability to compensate for deficits and improved coordination.  Patient to discharge at overall Modified Independent level.     Reasons goals not met: na  Recommendation:  No further OT needs at this time.   Equipment: commode, tub bench when able to access the 2nd floor bathroom  Reasons for discharge: treatment goals met  Patient/family agrees with progress made and goals achieved: Yes  OT Discharge Precautions/Restrictions  Precautions Precautions: Fall;Knee Precaution Comments: no knee flexion ROM Required Braces or Orthoses: Knee Immobilizer - Right;Knee Immobilizer - Left Knee Immobilizer - Right: On at all times Knee Immobilizer - Left: On at all times Restrictions Weight Bearing Restrictions: No RLE Weight Bearing: Weight bearing as tolerated LLE Weight Bearing: Weight bearing as tolerated Pain Pain Assessment Pain Score: 0-No pain ADL ADL Equipment Provided: Other (comment) (reviewed use of sock aide, reacher) Eating: Independent Where Assessed-Eating: Chair Grooming: Independent Where Assessed-Grooming: Standing at sink Upper Body Bathing: Modified independent Where Assessed-Upper Body Bathing: Sitting at sink,Shower Lower Body Bathing: Modified independent Where Assessed-Lower Body Bathing: Sitting at sink,Shower (bilateral legs covered) Upper Body Dressing: Independent Where Assessed-Upper Body Dressing: Chair Lower Body Dressing: Modified independent Where Assessed-Lower Body Dressing: Chair Toileting: Independent Where Assessed-Toileting: Glass blower/designer: Diplomatic Services operational officer Method: Counselling psychologist: Bedside commode,Grab bars,Raised Adult nurse: Not  assessed Social research officer, government: Close supervision Social research officer, government Method: Heritage manager: Radio broadcast assistant Vision Baseline Vision/History: Wears glasses Wears Glasses: At all times Patient Visual Report: No change from baseline Vision Assessment?: No apparent visual deficits Perception  Perception: Within Functional Limits Praxis Praxis: Intact Cognition Overall Cognitive Status: Within Functional Limits for tasks assessed Arousal/Alertness: Awake/alert Orientation Level: Oriented X4 Awareness: Appears intact Problem Solving: Appears intact Safety/Judgment: Appears intact Sensation Sensation Light Touch: Appears Intact Coordination Fine Motor Movements are Fluid and Coordinated: Yes Finger Nose Finger Test: WNL Motor  Motor Motor: Within Functional Limits Motor - Discharge Observations: progressing LE strength and balance Mobility  Bed Mobility Rolling Right: Independent Rolling Left: Independent Supine to Sit: Independent Transfers Sit to Stand: Independent with assistive device  Trunk/Postural Assessment  Cervical Assessment Cervical Assessment: Within Functional Limits Thoracic Assessment Thoracic Assessment: Within Functional Limits Lumbar Assessment Lumbar Assessment: Within Functional Limits Postural Control Postural Control: Within Functional Limits  Balance Static Sitting Balance Static Sitting - Level of Assistance: 7: Independent Dynamic Sitting Balance Dynamic Sitting - Level of Assistance: 7: Independent Static Standing Balance Static Standing - Level of Assistance: 6: Modified independent (Device/Increase time) Dynamic Standing Balance Dynamic Standing - Level of Assistance: 6: Modified independent (Device/Increase time) Extremity/Trunk Assessment RUE Assessment RUE Assessment: Within Functional Limits LUE Assessment LUE Assessment: Within Functional Limits   Carlos Levering 06/03/2020, 12:48 PM

## 2020-06-03 NOTE — Progress Notes (Signed)
Occupational Therapy Session Note  Patient Details  Name: CORNEILUS HEGGIE MRN: [id number] Date of Birth: [birth year]  Today's Date: 06/03/2020 OT Individual Time: 9800-1239 OT Individual Time Calculation (min): 58 min    Short Term Goals: Week 1:  OT Short Term Goal 1 (Week 1): STG = LTG 2/2 ELOS  Skilled Therapeutic Interventions/Progress Updates:    Patient in bathroom upon arrival.  No pain reported this session.  He is mod I to complete toileting, grooming, bathing and dressing tasks this am.  He is able to safely gather items needed for adl tasks with RW.  Ambulation on unit with RW supervision/mod I.   Completed UB ergometer in stance level 2 for 10 minutes.  Completed long walk (500+ feet) x2 with RW.  He returned to recliner at close of session, ice packs provided, call bell in reach and needs met.    Therapy Documentation Precautions:  Precautions Precautions: Fall,Knee Precaution Comments: no knee flexion ROM Required Braces or Orthoses: Knee Immobilizer - Right,Knee Immobilizer - Left Knee Immobilizer - Right: On at all times Knee Immobilizer - Left: On at all times Restrictions Weight Bearing Restrictions: No RLE Weight Bearing: Weight bearing as tolerated LLE Weight Bearing: Weight bearing as tolerated  Therapy/Group: Individual Therapy  Carlos Levering 06/03/2020, 7:32 AM

## 2020-06-03 NOTE — Progress Notes (Signed)
Physical Therapy Session Note  Patient Details  Name: [name] MRN: [id number] Date of Birth: 02/11/1966  Today's Date: 06/03/2020 PT Individual Time:Session1: 3536-1443; [name]: 1400-1500 PT Individual Time Calculation (min): 54 min & 60 min  Short Term Goals: Week 1:  PT Short Term Goal 1 (Week 1): STG=LTG due to ELOS  Skilled Therapeutic Interventions/Progress Updates:    Session1:  Patient in recliner and reports no news about getting equipment for d/c yet.  Patient sit to stand mod I in room to RW and ambulated 180' with RW and S completing side stepping at long railing in hallway then ambulation forward with 1 hand support on rail L then R hand.  Patient c/o L knee feeling as if would buckle.  Supine on mat to check leg braces. Noted R medial lock unlocked so fixed and tightened braces.  Patient supine for therex including hip abduction, SLR, bridging over ball, low trunk rotation with feet on a ball all x 10 reps.  Supine to sit mod I and pt sit to stand to RW mod I.  Negotiated 4 steps with rails and S.  Noted L brace with increased gapping on medial dial from leg.  Sit to supine to adjust braces again to move medial stabilizer bar more medial.  Noted improved positioning.  Patient ambulating with RW on compliant surface with S.  Then standing on compliant surface with no UE support to reach for and toss horse shoes with S.  Patient ambulated to room x 150' with RW and S.  Seated in recliner with ice to knees and needs in reach.  Session2:Patient in recliner and napping, but easily aroused.  Patient sit to stand mod I and ambulated x 160' to therapy gym.  Standing in parallel bars with compliant surface for stepping over and back hockey stick forward and side stepping for balance, stride length/foot clearance.  Patient standing in partial tandem on compliant surface with head turns L/R and up/down with L foot in front then R foot in front over 30 sec each.  Patient performed standing  punching on punching bag to front, to side and back with elbows for balance and core strength.  Patient ambulated x 81' to ortho gym and performed car transfer to 31" height to simulate [employer].  Patient performed standing balance tossing ball to self feet apart then in partial tandem.  Patient ambulated to room with RW S x 160'.  Left seated in recliner all needs in reach, ice packs to knees.   Therapy Documentation Precautions:  Precautions Precautions: Fall,Knee Precaution Comments: no knee flexion ROM Required Braces or Orthoses: Knee Immobilizer - Right,Knee Immobilizer - Left Knee Immobilizer - Right: On at all times Knee Immobilizer - Left: On at all times Restrictions Weight Bearing Restrictions: No RLE Weight Bearing: Weight bearing as tolerated LLE Weight Bearing: Weight bearing as tolerated Pain: Pain Assessment Pain Score: 0-No pain    Therapy/Group: Individual Therapy  Elray Mcgregor  Sheran Lawless, PT 06/03/2020, 12:11 PM

## 2020-06-04 LAB — GLUCOSE, CAPILLARY
Glucose-Capillary: 96 mg/dL (ref 70–99)
Glucose-Capillary: 110 mg/dL — ABNORMAL HIGH (ref 70–99)
Glucose-Capillary: 144 mg/dL — ABNORMAL HIGH (ref 70–99)
Glucose-Capillary: 129 mg/dL — ABNORMAL HIGH (ref 70–99)

## 2020-06-04 LAB — CBC WITH DIFFERENTIAL/PLATELET
Abs Immature Granulocytes: 0.06 10*3/uL (ref 0.00–0.07)
Basophils Absolute: 0.1 10*3/uL (ref 0.0–0.1)
Basophils Relative: 1 %
Eosinophils Absolute: 0.3 10*3/uL (ref 0.0–0.5)
Eosinophils Relative: 3 %
HCT: 43.7 % (ref 39.0–52.0)
Hemoglobin: 14.1 g/dL (ref 13.0–17.0)
Immature Granulocytes: 1 %
Lymphocytes Relative: 36 %
Lymphs Abs: 2.7 10*3/uL (ref 0.7–4.0)
MCH: 29.3 pg (ref 26.0–34.0)
MCHC: 32.3 g/dL (ref 30.0–36.0)
MCV: 90.7 fL (ref 80.0–100.0)
Monocytes Absolute: 0.7 10*3/uL (ref 0.1–1.0)
Monocytes Relative: 10 %
Neutro Abs: 3.9 10*3/uL (ref 1.7–7.7)
Neutrophils Relative %: 49 %
Platelets: 348 10*3/uL (ref 150–400)
RBC: 4.82 MIL/uL (ref 4.22–5.81)
RDW: 12.7 % (ref 11.5–15.5)
WBC: 7.7 10*3/uL (ref 4.0–10.5)
nRBC: 0 % (ref 0.0–0.2)

## 2020-06-04 NOTE — Progress Notes (Signed)
Physical Therapy Session Note  Patient Details  Name: Arthur Ayers MRN: [id number] Date of Birth: 10/14/1965  Today's Date: 06/04/2020 PT Individual Time: 3893-7342 PT Individual Time Calculation (min): 29 min   Short Term Goals: Week 1:  PT Short Term Goal 1 (Week 1): STG=LTG due to ELOS  Skilled Therapeutic Interventions/Progress Updates:    Pt received sitting in recliner. No report of pain and agreeable to therapy. STS mod I RW and gait training to therapy gym ~2108ft mod I with RW.  BLE strengthening in // bars SPV via standing hip abd and ext (3x10 BLE). During abd, VC required for keeping legs straight (to avoid coupling ER), avoiding hip hiking, and maintaining upright posture (to not couple hip flex). During hip ext, VC and tactile cuing required to avoid coupling back ext. Pt verbalized being able to feel L glute > R, but feeling both "working" nonetheless. Standing balance (CGA with RW for safety) via single leg stance ~30sec on BLE. Minor difficulty noted, but pt able to perform activity. Pt verbalized greater stability in L stance as compared to R; compensated for decreased R hip strength in R stance by laterally flexing to the R. Standing balance advanced to stance with contralateral limb alternating between flex and abd; able to complete 2x30sec with mod difficulty.  Gait training back to room mod I with RW ~247ft. Pt left in recliner with needs within reach.  Therapy Documentation Precautions:  Precautions Precautions: Fall,Knee Precaution Comments: no knee flexion ROM Required Braces or Orthoses: Knee Immobilizer - Right,Knee Immobilizer - Left Knee Immobilizer - Right: On at all times Knee Immobilizer - Left: On at all times Restrictions Weight Bearing Restrictions: No RLE Weight Bearing: Weight bearing as tolerated LLE Weight Bearing: Weight bearing as tolerated    Therapy/Group: Individual Therapy  Paschal Dopp, SPT 06/04/2020, 4:56 PM

## 2020-06-04 NOTE — Progress Notes (Signed)
Occupational Therapy Session Note  Patient Details  Name: Arthur Ayers MRN: [id number] Date of Birth: 1965/05/29  Today's Date: 06/04/2020 OT Individual Time: 1030-1130 OT Individual Time Calculation (min): 60 min    Short Term Goals: Week 1:  OT Short Term Goal 1 (Week 1): STG = LTG 2/2 ELOS  Skilled Therapeutic Interventions/Progress Updates:    Pt received sitting in recliner with no c/o pain. Pt completed 150 ft of functional mobility to the therapy gym with mod I using RW. Session focused on core strengthening/stability, BUE strengthening, and dynamic standing balance. Pt reported R rotator cuff deficits and special care/attention to increasing mobility with gravity eliminated/reduced with tactile input increased. Pt reports increased pain with overhead reaching and has deficits in full overhead abduction. Pt was guided through a pectoralis stretch with tightness in B pectoralis major in supine and then guided through chest flys with 5 lb dumbbells. Pt returned to his room and was left sitting up in the recliner.   Therapy Documentation Precautions:  Precautions Precautions: Fall,Knee Precaution Comments: no knee flexion ROM Required Braces or Orthoses: Knee Immobilizer - Right,Knee Immobilizer - Left Knee Immobilizer - Right: On at all times Knee Immobilizer - Left: On at all times Restrictions Weight Bearing Restrictions: No RLE Weight Bearing: Weight bearing as tolerated LLE Weight Bearing: Weight bearing as tolerated  Therapy/Group: Individual Therapy  Crissie Reese 06/04/2020, 6:34 AM

## 2020-06-04 NOTE — Patient Care Conference (Signed)
Inpatient RehabilitationTeam Conference and Plan of Care Update Date: 06/04/2020   Time: 11:45 AM    Patient Name: Arthur Ayers      Medical Record Number: [id number]  Date of Birth: 04-21-1965 Sex: Male         Room/Bed: 4M10C/4M10C-01 Payor Info: Payor: [employer] / Plan: [employer] / Product Type: *No Product type* /    Admit Date/Time:  05/22/2020  2:27 PM  Primary Diagnosis:  Patellar tendon rupture, unspecified laterality, sequela  Hospital Problems: Principal Problem:   Patellar tendon rupture, unspecified laterality, sequela Active Problems:   CKD (chronic kidney disease) stage 2, GFR 60-89 ml/min   Essential hypertension   Type II diabetes mellitus (HCC)   Diabetes mellitus, new onset (HCC)   Hyponatremia   Leukocytosis    Expected Discharge Date: Expected Discharge Date:  (discharge pending WC details/DME delivery)  Team Members Present: Physician leading conference: Dr. Maryla Morrow Care Coodinator Present: Becky Dupree, LCSW;Macio Kissoon Lambert Mody, RN, BSN, CRRN Nurse Present: Chana Bode, RN PT Present: Sheran Lawless, PT OT Present: Earleen Newport, OT PPS Coordinator present : Fae Pippin, SLP     Current Status/Progress Goal Weekly Team Focus  Bowel/Bladder             Swallow/Nutrition/ Hydration             ADL's   adl mod I, set up for shower, functional transfers mod I in room, supervision on unlevel surfaces  supervision/set up  discharge prep, awaiting DME delivery, general conditioning   Mobility   mod I in the room, S longer distances gait and stairs with 2 rails x 4 steps, S w/c mobility  S to mod I  standing balance, LE strength, endurance   Communication             Safety/Cognition/ Behavioral Observations            Pain             Skin               Discharge Planning:  ready for discharge awiating Workers Comp equipment and portable ramp, once receives can discharge home. Wife to work from home to provide supervision    Team Discussion: Patient is at goal level, awaiting DME delivery  Patient on target to meet rehab goals: yes, currently mod I for ADLs and supervision managing uneven surfaces. Basically mod I in his room.  *See Care Plan and progress notes for long and short-term goals.   Revisions to Treatment Plan:   Teaching Needs: Family education completed  Current Barriers to Discharge: Decreased caregiver support and Home enviroment access/layout  Possible Resolutions to Barriers: WC addressing recommended DME; discharge date pending confirmation of delivery of DME.     Medical Summary Current Status: B/L quad tendon repair secondary to tendon ruptures B/L  secondary to a fall at work, with WBAT/no flexion of knees B/L  Barriers to Discharge: Other (comments)  Barriers to Discharge Comments: Workman's Comp DME and ramp Possible Resolutions to [employer] Focus: Therapies, follow BP, pain, follow Na- labs ordered for tomorrow   Continued Need for Acute Rehabilitation Level of Care: The patient requires daily medical management by a physician with specialized training in physical medicine and rehabilitation for the following reasons: Analysis of laboratory values and/or radiology reports with any subsequent need for medication adjustment and/or medical intervention. : Yes   I attest that I was present, lead the team conference, and concur with the  assessment and plan of the team.   Pamelia Hoit 06/04/2020, 1:52 PM

## 2020-06-04 NOTE — Progress Notes (Signed)
Physical Therapy Session Note  Patient Details  Name: Arthur Ayers MRN: [id number] Date of Birth: Nov 16, 1965  Today's Date: 06/04/2020 PT Individual Time:Session1: 0830-0900; Session2: 1400-1500 PT Individual Time Calculation (min): 30 min & 60 min  Short Term Goals: Week 1:  PT Short Term Goal 1 (Week 1): STG=LTG due to ELOS  Skilled Therapeutic Interventions/Progress Updates:    Session1:  Patient in recliner and reports back pain due to stiffness sleeping on his back.  Patient sit to stand mod I and ambulated x 130' to therapy gym with RW mod I.  Patient supine on mat for low trunk rotation with feet on therapy ball x 10.  Stretches with sheet 3 x 30 sec with sheet for hamstrings then for TFL.  Patient with legs on wedge for pelvic stabilization post tilt with TA activation 5 sec hold x 10. Then TA activation with alternate leg lift.  Patient mod I to return to sit and then to stand to RW.  Ambulated to room with RW and distant S.  Left in room able to access recliner independent.   Session2:Patient in recliner without complaint.  Patient ambulated to dayroom with RW and S over 300'.  Standing to for boxing program on Wii no UE support with feet apart and in stride 3 rounds x 2 with seated rest in between.  Patient ambulated to therapy gym.  Side stepping and forward gait with 1 UE support on rail with S.  Patient sit to supine on mat for hamstring and TFL stretches as noted above.  Standing heel cord stretches 2 x 20 sec each leg.  Patient ambulated back to room and left in recliner.  Ice placed on knees and call bell iln reach.  Therapy Documentation Precautions:  Precautions Precautions: Fall,Knee Precaution Comments: no knee flexion ROM Required Braces or Orthoses: Knee Immobilizer - Right,Knee Immobilizer - Left Knee Immobilizer - Right: On at all times Knee Immobilizer - Left: On at all times Restrictions Weight Bearing Restrictions: No RLE Weight Bearing: Weight bearing as  tolerated LLE Weight Bearing: Weight bearing as tolerated Pain: Pain Assessment Pain Scale: 0-10 Pain Score: 5  Faces Pain Scale: Hurts a little bit Pain Type: Acute pain Pain Location: Back Pain Orientation: Lower Pain Descriptors / Indicators: Aching Pain Frequency: Constant Pain Onset: On-going Patients Stated Pain Goal: 2 Pain Intervention(s): Ambulation/increased activity;Repositioned   Therapy/Group: Individual Therapy  Elray Mcgregor  Sheran Lawless, PT 06/04/2020, 8:47 AM

## 2020-06-04 NOTE — Progress Notes (Signed)
Patient ID: Arthur Ayers, male   DOB: 1965-11-25, 56 y.o.   MRN: [id number] Updated regarding [employer] reports ramp and hospital bed to be delivered tomorrow unsure of time and can hope for discharge 4/22. [employer]indi has called pt's wife. Aware of team conference and at goal level, just awaiting workers comp for equipment.

## 2020-06-04 NOTE — Progress Notes (Signed)
[name] PHYSICAL MEDICINE & REHABILITATION PROGRESS NOTE  Subjective/Complaints: Patient seen working with therapy this morning.  He states he slept well overnight.  He states that his hospital bed will arrive tomorrow, but he does not have an update on his ramp. He states he had a BM this AM. Discussed plans for outpatient therapies with therpaies.   ROS: Denies CP, SOB, N/V/D  Objective: Vital Signs: Blood pressure 118/73, pulse 66, temperature 97.6 F (36.4 C), resp. rate 20, height 5\' 9"  (1.753 m), weight 99.8 kg, SpO2 100 %. No results found. Recent Labs    06/04/20 0530  WBC 7.7  HGB 14.1  HCT 43.7  PLT 348   No results for input(s): NA, K, CL, CO2, GLUCOSE, BUN, CREATININE, CALCIUM in the last 72 hours.  Intake/Output Summary (Last 24 hours) at 06/04/2020 0907 Last data filed at 06/04/2020 0700 Gross per 24 hour  Intake 660 ml  Output --  Net 660 ml        Physical Exam: BP 118/73 (BP Location: Left Arm)   Pulse 66   Temp 97.6 F (36.4 C)   Resp 20   Ht 5\' 9"  (1.753 m)   Wt 99.8 kg   SpO2 100%   BMI 32.49 kg/m  Constitutional: No distress . Vital signs reviewed. HENT: Normocephalic.  Atraumatic. Eyes: EOMI. No discharge. Cardiovascular: No JVD.  RRR. Respiratory: Normal effort.  No stridor.  Bilateral clear to auscultation. GI: Non-distended.  BS +. Skin: Warm and dry.  Intact. Psych: Normal mood.  Normal behavior. Musc: No edema in extremities.  No tenderness in extremities. Neuro: Alert Motor: 5/5 proximal to distal B/l LE: HF: 4+/5, Knee limited due bracing, ADF 5/5, improving  Assessment/Plan: 1. Functional deficits which require 3+ hours per day of interdisciplinary therapy in a comprehensive inpatient rehab setting.  Physiatrist is providing close team supervision and 24 hour management of active medical problems listed below.  Physiatrist and rehab team continue to assess barriers to discharge/monitor patient progress toward functional and  medical goals   Care Tool:  Bathing    Body parts bathed by patient: Right arm,Left arm,Chest,Abdomen,Front perineal area,Buttocks,Right upper leg,Left upper leg,Face,Right lower leg,Left lower leg     Body parts n/a: Left lower leg,Right lower leg (covered by brace)   Bathing assist Assist Level: Independent with assistive device     Upper Body Dressing/Undressing Upper body dressing   What is the patient wearing?: Pull over shirt    Upper body assist Assist Level: Independent    Lower Body Dressing/Undressing Lower body dressing      What is the patient wearing?: Underwear/pull up,Pants     Lower body assist Assist for lower body dressing: Independent     Toileting Toileting    Toileting assist Assist for toileting: Independent Assistive Device Comment:  (urinal)   Transfers Chair/bed transfer  Transfers assist     Chair/bed transfer assist level: Independent with assistive device Chair/bed transfer assistive device: Walker,Armrests   Locomotion Ambulation   Ambulation assist      Assist level: Supervision/Verbal cueing Assistive device: Walker-rolling Max distance: 200'   Walk 10 feet activity   Assist     Assist level: Independent with assistive device Assistive device: Walker-rolling,Orthosis   Walk 50 feet activity   Assist    Assist level: Independent with assistive device Assistive device: Walker-rolling,Orthosis    Walk 150 feet activity   Assist Walk 150 feet activity did not occur: Safety/medical concerns  Assist level: Supervision/Verbal cueing Assistive  device: Walker-rolling,Orthosis    Walk 10 feet on uneven surface  activity   Assist     Assist level: Supervision/Verbal cueing Assistive device: Walker-rolling,Orthosis   Wheelchair     Assist Will patient use wheelchair at discharge?: Yes Type of Wheelchair: Manual    Wheelchair assist level: Supervision/Verbal cueing Max wheelchair distance: 150     Wheelchair 50 feet with 2 turns activity    Assist        Assist Level: Supervision/Verbal cueing   Wheelchair 150 feet activity     Assist      Assist Level: Supervision/Verbal cueing    Medical Problem List and Plan: 1.  B/L quad tendon repair secondary to tendon ruptures B/L  secondary to a fall at work, with WBAT/no flexion of knees B/L  Continue CIR, waiting for ramp and hospital bed  Team conference today to discuss current and goals and coordination of care, home and environmental barriers, and discharge planning with nursing, case manager, and therapies. Please see conference note from today as well.  2.  Impaired mobility: -DVT/anticoagulation:  Pharmaceutical: d/c Lovenox since ambulating >250 feet             -antiplatelet therapy: N/A 3. Pain Management:  Decrease ultram to 50 mg TID scheduled, d/ced oxycodone.   Controlled on 4/20 4. Mood: LCSW to follow for evaluation and support.              -antipsychotic agents: N/A 5. Neuropsych: This patient is capable of making decisions on his own behalf. 6. B/l patellar incisions: healing well, continue to monitor.   7. Fluids/Electrolytes/Nutrition: Monitor I/Os 8. HTN: Monitor BP TID--decreased avapro to 75mg   Controlled on 4/20  Monitor with increased mobility 9. GERD: Continue Protonix. 10. Leucocytosis: Resolved  Afebrile  Monitor for fevers and signs of infection. 11. New diagnosis T2DM: has been pre-diabetic. Now with Hgb A1C-7.1.     CBG (last 3)  Recent Labs    06/03/20 1625 06/03/20 2106 06/04/20 0630  GLUCAP 90 123* 96   Relatively controlled on 4/20 12. ABLA:  Resolved  Hemoglobin 14.1 on 4/20  Continue to monitor 13. Hyponatremia:   Sodium 132 on 4/15, labs ordered for tomorrow  Continue to monitor 14. Hypoalbuminemia:  Protein levels trending down. Encourage high protein foods.  15.  Bowels-  Initially having loose stools, so decreased senokot S to 1 tab nightly from 2 tabs  nightly  He states he had a BM this AM 15.  Mild transaminitis  ALT mildly elevated on 4/8 16. Obesity BMI 32.49: continue dietary education  LOS: 13 days A FACE TO FACE EVALUATION WAS PERFORMED  Arthur Ayers 6/8 06/04/2020, 9:07 AM

## 2020-06-05 DIAGNOSIS — R7401 Elevation of levels of liver transaminase levels: Secondary | ICD-10-CM

## 2020-06-05 LAB — HEPATIC FUNCTION PANEL
ALT: 35 U/L (ref 0–44)
AST: 27 U/L (ref 15–41)
Albumin: 3.4 g/dL — ABNORMAL LOW (ref 3.5–5.0)
Alkaline Phosphatase: 72 U/L (ref 38–126)
Bilirubin, Direct: 0.1 mg/dL (ref 0.0–0.2)
Indirect Bilirubin: 0.8 mg/dL (ref 0.3–0.9)
Total Bilirubin: 0.9 mg/dL (ref 0.3–1.2)
Total Protein: 7.6 g/dL (ref 6.5–8.1)

## 2020-06-05 LAB — BASIC METABOLIC PANEL
Anion gap: 6 (ref 5–15)
BUN: 14 mg/dL (ref 6–20)
CO2: 29 mmol/L (ref 22–32)
Calcium: 9.1 mg/dL (ref 8.9–10.3)
Chloride: 102 mmol/L (ref 98–111)
Creatinine, Ser: 1.14 mg/dL (ref 0.61–1.24)
GFR, Estimated: >60 mL/min (ref >60)
Glucose, Bld: 113 mg/dL — ABNORMAL HIGH (ref 70–99)
Potassium: 3.9 mmol/L (ref 3.5–5.1)
Sodium: 137 mmol/L (ref 135–145)

## 2020-06-05 LAB — GLUCOSE, CAPILLARY
Glucose-Capillary: 109 mg/dL — ABNORMAL HIGH (ref 70–99)
Glucose-Capillary: 109 mg/dL — ABNORMAL HIGH (ref 70–99)
Glucose-Capillary: 87 mg/dL (ref 70–99)
Glucose-Capillary: 123 mg/dL — ABNORMAL HIGH (ref 70–99)

## 2020-06-05 NOTE — Progress Notes (Signed)
Occupational Therapy Session Note  Patient Details  Name: Arthur Ayers MRN: [id number] Date of Birth: 12-23-1965  Today's Date: 06/05/2020 OT Individual Time: 1447-1530 OT Individual Time Calculation (min): 43 min    Short Term Goals: Week 1:  OT Short Term Goal 1 (Week 1): STG = LTG 2/2 ELOS  Skilled Therapeutic Interventions/Progress Updates:    Pt completed functional mobility during session down and back from the therapy gym with modified independence using the RW for support.  He was able to complete BUE light therex during session sitting EOB for increased rotator strengthening.  He completed 2 sets of 15 reps for shoulder external rotation with arm at his side as well as 1 set of 10 reps with arm at 90 degrees abduction while integrating external rotation.  He also completed 1 set of 15 reps for shoulder row as well as shoulder horizontal abduction.  Medium resistance orange therapy band used for all exercises.  Finished session with pt sitting in the recliner with call button in reach.  He is currently modified independent level in his room.   Therapy Documentation Precautions:  Precautions Precautions: Fall,Knee Precaution Comments: no knee flexion ROM Required Braces or Orthoses: Other Brace (bilateral bledsoe brace) Knee Immobilizer - Right: On at all times Knee Immobilizer - Left: On at all times Restrictions Weight Bearing Restrictions: No RLE Weight Bearing: Weight bearing as tolerated LLE Weight Bearing: Weight bearing as tolerated  Pain: Pain Assessment Pain Scale: Faces Pain Score: 0-No pain ADL: See Care Tool Section for some details of mobility and selfcare  Therapy/Group: Individual Therapy  Kimmy Parish OTR/L 06/05/2020, 4:39 PM

## 2020-06-05 NOTE — Progress Notes (Signed)
Physical Therapy Session Note  Patient Details  Name: Arthur Ayers MRN: [id number] Date of Birth: Feb 11, 1966  Today's Date: 06/05/2020 PT Individual Time:Session1: 5573-2202; Session2: 1300-1400 PT Individual Time Calculation (min): 27 min & 60 min  Short Term Goals: Week 1:  PT Short Term Goal 1 (Week 1): STG=LTG due to ELOS  Skilled Therapeutic Interventions/Progress Updates:    Session1: Patient in recliner and reports feeling well, slight stiffness in back from sitting a lot.  Patient sit to stand mod I, then noticed shorts on backwards.  Patient seated to doff then don shorts mod I.  Donned shoes with max A.  Patient ambulated to therapy gym 180' with RW mod I.  Adjusted braces for positioning.  Patient supine for therex including SLR, Hip adductor squeezes, hip abduction and bridging with legs on wedge and 5 sec hold all x 10.  In ortho gym patient performed car transfer with S to simulated [employer] height sliding in keeping legs supported on seat as if in back seat and legs on bench.  Patient ambulated to room 120' with RW mod I and seated in recliner with needs in reach.  Session2:  Patient in recliner and reports feeling well.  Sit to stand mod I and ambulated x 300' off unit, down elevator and out to patio outside [employer] [facility].  Seated rest on bench.  Kept w/c close if needed, but pt did not use.  Discussed using bench sitting near armrest as lower seat harder to push up from.  Patient ambulated unlevel paved surface around fountain and over slate and pavement with cracks.  Patient ambulated in gift shop around aisles and displays with wide RW without issues/LOB or difficulty managing.  Patient ambulated back to unit to therapy gym over 500' without seated rest.  Patient negotiated 12 steps with bilateral rails and S with cues for managing descent.  Patient standing at counter to perform hip flexion, extension and abduction x 10 each with UE support, then ant/post  rocking with hands on counter for support x 10 reps.  Patient ambulated to room with RW mod I.  Brought ice to place on knees and pt in recliner with needs in reach.   Therapy Documentation Precautions:  Precautions Precautions: Fall,Knee Precaution Comments: no knee flexion ROM Required Braces or Orthoses: Knee Immobilizer - Right,Knee Immobilizer - Left Knee Immobilizer - Right: On at all times Knee Immobilizer - Left: On at all times Restrictions Weight Bearing Restrictions: Yes RLE Weight Bearing: Weight bearing as tolerated LLE Weight Bearing: Weight bearing as tolerated Pain: Pain Assessment Pain Scale: 0-10 Pain Score: 0-No pain   Therapy/Group: Individual Therapy  Elray Mcgregor  Sheran Lawless, PT 06/05/2020, 8:55 AM

## 2020-06-05 NOTE — Progress Notes (Signed)
[facility] PHYSICAL MEDICINE & REHABILITATION PROGRESS NOTE  Subjective/Complaints: Patient seen sitting up in his chair this morning.  He states he slept well overnight.  He is hopeful that his hospital bed and ramp are delivered today.  ROS: Denies CP, SOB, N/V/D  Objective: Vital Signs: Blood pressure 117/72, pulse 77, temperature 98.2 F (36.8 C), temperature source Oral, resp. rate 16, height 5\' 9"  (1.753 m), weight 99.8 kg, SpO2 98 %. No results found. Recent Labs    06/04/20 0530  WBC 7.7  HGB 14.1  HCT 43.7  PLT 348   Recent Labs    06/05/20 0517  NA 137  K 3.9  CL 102  CO2 29  GLUCOSE 113*  BUN 14  CREATININE 1.14  CALCIUM 9.1    Intake/Output Summary (Last 24 hours) at 06/05/2020 0905 Last data filed at 06/05/2020 0700 Gross per 24 hour  Intake 780 ml  Output 850 ml  Net -70 ml        Physical Exam: BP 117/72 (BP Location: Left Arm)   Pulse 77   Temp 98.2 F (36.8 C) (Oral)   Resp 16   Ht 5\' 9"  (1.753 m)   Wt 99.8 kg   SpO2 98%   BMI 32.49 kg/m  Constitutional: No distress . Vital signs reviewed. HENT: Normocephalic.  Atraumatic. Eyes: EOMI. No discharge. Cardiovascular: No JVD.  RRR. Respiratory: Normal effort.  No stridor.  Bilateral clear to auscultation. GI: Non-distended.  BS +. Skin: Warm and dry.  Intact. Psych: Normal mood.  Normal behavior. Musc: No edema in extremities.  No tenderness in extremities. Neuro: Alert Motor: 5/5 proximal to distal B/l LE: HF: 4+/5, Knee limited due bracing, ADF 5/5, unchanged  Assessment/Plan: 1. Functional deficits which require 3+ hours per day of interdisciplinary therapy in a comprehensive inpatient rehab setting.  Physiatrist is providing close team supervision and 24 hour management of active medical problems listed below.  Physiatrist and rehab team continue to assess barriers to discharge/monitor patient progress toward functional and medical goals   Care Tool:  Bathing    Body parts  bathed by patient: Right arm,Left arm,Chest,Abdomen,Front perineal area,Buttocks,Right upper leg,Left upper leg,Face,Right lower leg,Left lower leg     Body parts n/a: Left lower leg,Right lower leg (covered by brace)   Bathing assist Assist Level: Independent with assistive device     Upper Body Dressing/Undressing Upper body dressing   What is the patient wearing?: Pull over shirt    Upper body assist Assist Level: Independent    Lower Body Dressing/Undressing Lower body dressing      What is the patient wearing?: Underwear/pull up,Pants     Lower body assist Assist for lower body dressing: Independent     Toileting Toileting    Toileting assist Assist for toileting: Independent Assistive Device Comment:  (urinal)   Transfers Chair/bed transfer  Transfers assist     Chair/bed transfer assist level: Independent with assistive device Chair/bed transfer assistive device: Walker,Armrests   Locomotion Ambulation   Ambulation assist      Assist level: Independent with assistive device Assistive device: Orthosis Max distance: 200'   Walk 10 feet activity   Assist     Assist level: Independent with assistive device Assistive device: Walker-rolling,Orthosis   Walk 50 feet activity   Assist    Assist level: Independent with assistive device Assistive device: Walker-rolling,Orthosis    Walk 150 feet activity   Assist Walk 150 feet activity did not occur: Safety/medical concerns  Assist level: Independent  with assistive device Assistive device: Walker-rolling,Orthosis    Walk 10 feet on uneven surface  activity   Assist     Assist level: Supervision/Verbal cueing Assistive device: Walker-rolling,Orthosis   Wheelchair     Assist Will patient use wheelchair at discharge?: Yes Type of Wheelchair: Manual    Wheelchair assist level: Supervision/Verbal cueing Max wheelchair distance: 150    Wheelchair 50 feet with 2 turns  activity    Assist        Assist Level: Supervision/Verbal cueing   Wheelchair 150 feet activity     Assist      Assist Level: Supervision/Verbal cueing    Medical Problem List and Plan: 1.  B/L quad tendon repair secondary to tendon ruptures B/L  secondary to a fall at work, with WBAT/no flexion of knees B/L  Continue CIR, waiting for ramp and hospital bed, hopefully today 2.  Impaired mobility: -DVT/anticoagulation:  Pharmaceutical: d/c Lovenox since ambulating >250 feet             -antiplatelet therapy: N/A 3. Pain Management:  Decrease ultram to 50 mg TID scheduled, d/ced oxycodone.   Controlled on 4/21 4. Mood: LCSW to follow for evaluation and support.              -antipsychotic agents: N/A 5. Neuropsych: This patient is capable of making decisions on his own behalf. 6. B/l patellar incisions: healing well, continue to monitor.   7. Fluids/Electrolytes/Nutrition: Monitor I/Os 8. HTN: Monitor BP TID--decreased avapro to 75mg   Controlled on 4/21  Monitor with increased mobility 9. GERD: Continue Protonix. 10. Leucocytosis: Resolved  Afebrile  Monitor for fevers and signs of infection. 11. New diagnosis T2DM: has been pre-diabetic. Now with Hgb A1C-7.1.     CBG (last 3)  Recent Labs    06/04/20 1655 06/04/20 2122 06/05/20 0601  GLUCAP 144* 129* 109*   Relatively controlled on 4/21 12. ABLA:  Resolved  Hemoglobin 14.1 on 4/20  Continue to monitor 13. Hyponatremia:   Sodium 137 on 4/21  Continue to monitor 14. Hypoalbuminemia:  Protein levels trending down. Encourage high protein foods.  15.  Bowels-  Initially having loose stools, so decreased senokot S to 1 tab nightly from 2 tabs nightly  Improving 15.  Mild transaminitis  ALT mildly elevated on 4/8, labs pending 16. Obesity BMI 32.49: continue dietary education  LOS: 14 days A FACE TO FACE EVALUATION WAS PERFORMED  Arthur Ayers 6/8 06/05/2020, 9:05 AM

## 2020-06-05 NOTE — Progress Notes (Signed)
Occupational Therapy Session Note  Patient Details  Name: Arthur Ayers MRN: [id number] Date of Birth: 02-11-1966  Today's Date: 06/05/2020 OT Individual Time: 1100-1200 OT Individual Time Calculation (min): 60 min    Short Term Goals: Week 1:  OT Short Term Goal 1 (Week 1): STG = LTG 2/2 ELOS  Skilled Therapeutic Interventions/Progress Updates:    Patient asleep in recliner.  He wakes with min stim.  He denies pain and states that he is ready for therapy session.  Completed ambulation on long hospital hallways - mod I with RW.   Upper body ergometer x 10 minutes.  He notes that DME is to be delivered today and hopefully set up tomorrow.  Reviewed plan for discharge.  Reviewed and completed upper body stretch and posture activities with good carryover.  He returned to recliner at close of session, all needs within reach.    Therapy Documentation Precautions:  Precautions Precautions: Fall,Knee Precaution Comments: no knee flexion ROM Required Braces or Orthoses: Knee Immobilizer - Right,Knee Immobilizer - Left Knee Immobilizer - Right: On at all times Knee Immobilizer - Left: On at all times Restrictions Weight Bearing Restrictions: Yes RLE Weight Bearing: Weight bearing as tolerated LLE Weight Bearing: Weight bearing as tolerated   Therapy/Group: Individual Therapy  [name] 06/05/2020, 8:35 AM

## 2020-06-06 LAB — GLUCOSE, CAPILLARY: Glucose-Capillary: 104 mg/dL — ABNORMAL HIGH (ref 70–99)

## 2020-06-06 MED ORDER — PANTOPRAZOLE SODIUM 40 MG PO TBEC: 40.0000 mg | DELAYED_RELEASE_TABLET | Freq: Every day | ORAL | 0 refills | Status: AC

## 2020-06-06 MED ORDER — ASPIRIN 81 MG PO CHEW: 81.0000 mg | CHEWABLE_TABLET | Freq: Two times a day (BID) | ORAL | 0 refills | Status: AC

## 2020-06-06 MED ORDER — TRAMADOL HCL 50 MG PO TABS: 50.0000 mg | ORAL_TABLET | Freq: Three times a day (TID) | ORAL | 0 refills | Status: AC

## 2020-06-06 MED ORDER — SIMVASTATIN 20 MG PO TABS: 20.0000 mg | ORAL_TABLET | Freq: Every day | ORAL | 0 refills | Status: AC

## 2020-06-06 MED ORDER — SENNOSIDES-DOCUSATE SODIUM 8.6-50 MG PO TABS: 1.0000 | ORAL_TABLET | Freq: Every day | ORAL | 0 refills | Status: AC

## 2020-06-06 MED ORDER — METHOCARBAMOL 500 MG PO TABS: 500.0000 mg | ORAL_TABLET | Freq: Four times a day (QID) | ORAL | 0 refills | Status: DC | PRN

## 2020-06-06 MED ORDER — ACETAMINOPHEN 325 MG PO TABS: 325.0000 mg | ORAL_TABLET | ORAL | Status: AC | PRN

## 2020-06-06 NOTE — Discharge Instructions (Signed)
Inpatient Rehab Discharge Instructions  Arthur Ayers Discharge date and time:  06/06/20  Activities/Precautions/ Functional Status: Activity: no lifting, driving, or strenuous exercise till cleared by MD Diet: diabetic diet Wound Care: Wash with soap and water. Keep incisions clean and dry. Contact Dr. Greggory Keen if you develop any problems with your incision/wound--redness, swelling, increase in pain, drainage or if you develop fever or chills.    Functional status:  ___ No restrictions     ___ Walk up steps independently ___ 24/7 supervision/assistance   ___ Walk up steps with assistance _X__ Intermittent supervision/assistance  ___ Bathe/dress independently _X__ Walk with walker    ___ Bathe/dress with assistance ___ Walk Independently    ___ Shower independently ___ Walk with assistance    _X__ Shower with assistance _X__ No alcohol     ___ Return to work/school ________  Special Instructions: 1. Need to wear Braces on both legs at all times.    COMMUNITY REFERRALS UPON DISCHARGE:   [employer] COULD NOT FIND A HOME HEALTH AGENCY TO ACCEPT PT'S CASE OR THEIR INSURANCE WAS NOT IN NETWORK  Outpatient: PT             Agency:[employer] WORKING ON WILL UPDATE PATIENT/WIFE WHEN ARRANGED              Medical Equipment/Items Ordered:HOSPITAL BED, WHEELCHAIR, HIP KIT, ROLLING WALKER, 3 IN 1, PORTABLE RAMP                                                 Agency/Supplier:[employer]   My questions have been answered and I understand these instructions. I will adhere to these goals and the provided educational materials after my discharge from the hospital.  Patient/Caregiver Signature _______________________________ Date __________  Clinician Signature _______________________________________ Date __________  Please bring this form and your medication list with you to all your follow-up doctor's appointments.

## 2020-06-06 NOTE — Discharge Summary (Addendum)
Physician Discharge Summary  Patient ID: Arthur Ayers MRN: [id number] DOB/AGE: [birth year] 55 y.o.  Admit date: 05/22/2020 Discharge date: 06/06/2020  Discharge Diagnoses:  Principal Problem:   Patellar tendon rupture, unspecified laterality, sequela Active Problems:   Transaminitis   Essential hypertension   Type II diabetes mellitus (HCC)   Diabetes mellitus, new onset (HCC)   Discharged Condition: stable   Significant Diagnostic Studies: N/A   Labs:  Basic Metabolic Panel: BMP Latest Ref Rng & Units 06/05/2020 05/30/2020 05/26/2020  Glucose 70 - 99 mg/dL 734(L) 937(T) 024(O)  BUN 6 - 20 mg/dL 14 14 17   Creatinine 0.61 - 1.24 mg/dL 9.73 5.32  Sodium 135 - 145 mmol/L 137 132(L) 132(L)  Potassium 3.5 - 5.1 mmol/L 3.9 4.0 4.1  Chloride 98 - 111 mmol/L 102 99 97(L)  CO2 22 - 32 mmol/L 29 26 28   Calcium 8.9 - 10.3 mg/dL 9.1 8.9 9.0    CBC: CBC Latest Ref Rng & Units 06/04/2020 05/26/2020 05/23/2020  WBC 4.0 - 10.5 K/uL 7.7 12.9(H) 14.0(H)  Hemoglobin 13.0 - 17.0 g/dL 07/26/2020 12.8(L) 12.7(L)  Hematocrit 39.0 - 52.0 % 43.7 38.2(L) 38.6(L)  Platelets 150 - 400 K/uL 348 454(H) 391    CBG: Recent Labs  Lab 06/05/20 0601 06/05/20 1156 06/05/20 1643 06/05/20 2109 06/06/20 0602  GLUCAP 109* 109* 87 123* 104*    Brief HPI:   Arthur Ayers is a 55 y.o. male with history of HTN, GERD, HNP cervical/lumbar spine who injured his leg while getting out of his [employer] truck with inability to stand or weight-bear on BLE on 03/29.  He was left patella fracture with bilateral quad tendon.  He was taken to the OR for repair of bilateral quad tendon by Dr. 44 on 01/31.  He was made WBAT with Bledsoe braces in place.  Lovenox for DVT prophylaxis with recommendations to transition to ASA at discharge.  Pain control, urinalysis, hyponatremia, hyperglycemia as well as leukocytosis.  Therapy was ongoing and patient was noted to have limitations in ADLs as well as mobility.  CIR was recommended  due to functional decline.   Hospital Course: Arthur Ayers was admitted to rehab 05/22/2020 for inpatient therapies to consist of PT and OT at least three hours five days a week. Past admission physiatrist, therapy team and rehab RN have worked together to provide customized collaborative inpatient rehab.  He was maintained on subcu Lovenox during his stay and transition to low-dose aspirin twice daily at discharge. His blood pressures were monitored on TID basis and Avapro was decreased to 75 mg to avoid hypotension.  GERD symptoms with managed with use of Protonix.  Serial check of CBC showed that leukocytosis had resolved and he has been afebrile during his stay.  Acute blood loss anemia and mild transaminitis have also resolved.    His surgical dressings were changed by orthopedics on 04/14 and incisions have been clean, dry, intact and healing well without any signs or symptoms of infection.  He was noted to have transitioned to diabetes with hemoglobin A1c of 7.1 and was placed on carb modified diet.07/22/2020  His blood sugars were monitored with ac/hs CBG checks and SSI was use prn for tighter BS control.  His blood sugars have been relatively controlled on diet alone.  Pain control has been stable and he was weaned off oxycodone prior to discharge. His length of stay was extended by [employer] due to lack of ramp and difficulty with home entry due to  stairs. He has progressed to modified independent level and was discharged to home on 04/22 and improved condition.  [employer]. was unable to find home health agency for follow-up therapy to transition to home.     Rehab course: During patient's stay in rehab weekly team conferences were held to monitor patient's progress, set goals and discuss barriers to discharge. At admission, patient required min assist with mobility and with ADL tasks. He  has had improvement in activity tolerance, balance, postural control as well as ability to compensate for  deficits.  He is able to complete ADL tasks at modified independent level.  He is modified independent for transfers and is able to ambulate 200 feet with rolling walker.   Discharge disposition: 01-Home or Self Care  Diet: Carb Modified  Special Instructions: 1.  Need to wear Bledsoe braces at all times.   Discharge Instructions     Ambulatory referral to Physical Medicine Rehab   Complete by: As directed    3-4 weeks follow up appt      Allergies as of 06/06/2020   No Known Allergies      Medication List     STOP taking these medications    co-enzyme Q-10 30 MG capsule   docusate sodium 100 MG capsule Commonly known as: COLACE   enoxaparin 40 MG/0.4ML injection Commonly known as: LOVENOX   esomeprazole 40 MG capsule Commonly known as: NEXIUM   Fish Oil 1000 MG Caps   ondansetron 4 MG tablet Commonly known as: ZOFRAN   Osteo Bi-Flex-Glucos/5-Loxin Tabs   oxyCODONE 5 MG immediate release tablet Commonly known as: Roxicodone       TAKE these medications    acetaminophen 325 MG tablet Commonly known as: TYLENOL Take 1-2 tablets (325-650 mg total) by mouth every 4 (four) hours as needed for mild pain. What changed:  medication strength how much to take when to take this   aspirin 81 MG chewable tablet Chew 1 tablet (81 mg total) by mouth 2 (two) times daily. Notes to patient: To prevent blood clots   cetirizine 10 MG tablet Commonly known as: ZYRTEC Take 10 mg by mouth daily as needed for allergies.   methocarbamol 500 MG tablet Commonly known as: ROBAXIN Take 1 tablet (500 mg total) by mouth every 6 (six) hours as needed for muscle spasms. Notes to patient: Was getting it three times a day--use as needed   multivitamin tablet Take 1 tablet by mouth every morning.   pantoprazole 40 MG tablet Commonly known as: PROTONIX Take 1 tablet (40 mg total) by mouth daily.   senna-docusate 8.6-50 MG tablet Commonly known as: Senokot-S Take 1 tablet  by mouth daily with supper.   simvastatin 20 MG tablet Commonly known as: ZOCOR Take 1 tablet (20 mg total) by mouth daily at 6 PM.   telmisartan 40 MG tablet Commonly known as: MICARDIS Take 40 mg by mouth daily.   traMADol 50 MG tablet--Rx# 28 pills Commonly known as: ULTRAM Take 1 tablet (50 mg total) by mouth 3 (three) times daily. Notes to patient: Can change to as needed        Follow-up Information     Marcello Fennel, MD Follow up.   Specialty: Physical Medicine and Rehabilitation Why: Office will call you with follow up appt Contact information: [address] [place] [address] [phone]         Laqueta Due., MD. Call.   Specialty: Internal Medicine Why: for post  hospital follow up Contact information: [address] [place] [address] [phone]         Sheral Apley, MD. Schedule an appointment as soon as possible for a visit in 4 week(s).   Specialty: Orthopedic Surgery Why: Need appointment for 6 week post-op mark Contact information: [address] [place] [address] [place] [address] [phone]                 Signed: Jacquelynn Cree 06/10/2020, 6:00 PM Patient was seen, face-face, and physical exam performed by me on day of discharge, greater than 30 minutes of total time spent.. Please see progress note from day of discharge as well.  Maryla Morrow, MD, ABPMR

## 2020-06-06 NOTE — Progress Notes (Signed)
[facility] PHYSICAL MEDICINE & REHABILITATION PROGRESS NOTE  Subjective/Complaints: Patient seen sitting up in his chair this AM.  He states he slept well overnight. His ramp has not been delivered, but he will be transported by ambulance.   ROS: Denies CP, SOB, N/V/D  Objective: Vital Signs: Blood pressure 114/67, pulse 63, temperature 97.6 F (36.4 C), temperature source Oral, resp. rate 19, height 5\' 9"  (1.753 m), weight 99.8 kg, SpO2 98 %. No results found. Recent Labs    06/04/20 0530  WBC 7.7  HGB 14.1  HCT 43.7  PLT 348   Recent Labs    06/05/20 0517  NA 137  K 3.9  CL 102  CO2 29  GLUCOSE 113*  BUN 14  CREATININE 1.14  CALCIUM 9.1    Intake/Output Summary (Last 24 hours) at 06/06/2020 1222 Last data filed at 06/06/2020 0804 Gross per 24 hour  Intake 620 ml  Output --  Net 620 ml        Physical Exam: BP 114/67 (BP Location: Left Arm)   Pulse 63   Temp 97.6 F (36.4 C) (Oral)   Resp 19   Ht 5\' 9"  (1.753 m)   Wt 99.8 kg   SpO2 98%   BMI 32.49 kg/m  Constitutional: No distress . Vital signs reviewed. HENT: Normocephalic.  Atraumatic. Eyes: EOMI. No discharge. Cardiovascular: No JVD.  RRR. Respiratory: Normal effort.  No stridor.  Bilateral clear to auscultation. GI: Non-distended.  BS +. Skin: Warm and dry.  Intact. Psych: Normal mood.  Normal behavior. Musc: No edema in extremities.  No tenderness in extremities. Neuro: Alert Motor: 5/5 proximal to distal B/l LE: HF: 4+/5, Knee limited due bracing, ADF 5/5, improving  Assessment/Plan: 1. Functional deficits which require 3+ hours per day of interdisciplinary therapy in a comprehensive inpatient rehab setting.  Physiatrist is providing close team supervision and 24 hour management of active medical problems listed below.  Physiatrist and rehab team continue to assess barriers to discharge/monitor patient progress toward functional and medical goals   Care Tool:  Bathing    Body parts  bathed by patient: Right arm,Left arm,Chest,Abdomen,Front perineal area,Buttocks,Right upper leg,Left upper leg,Face,Right lower leg,Left lower leg     Body parts n/a: Left lower leg,Right lower leg (covered by brace)   Bathing assist Assist Level: Independent with assistive device     Upper Body Dressing/Undressing Upper body dressing   What is the patient wearing?: Pull over shirt    Upper body assist Assist Level: Independent    Lower Body Dressing/Undressing Lower body dressing      What is the patient wearing?: Underwear/pull up,Pants     Lower body assist Assist for lower body dressing: Independent     Toileting Toileting    Toileting assist Assist for toileting: Independent Assistive Device Comment:  (urinal)   Transfers Chair/bed transfer  Transfers assist     Chair/bed transfer assist level: Independent with assistive device Chair/bed transfer assistive device: Walker,Armrests   Locomotion Ambulation   Ambulation assist      Assist level: Independent with assistive device Assistive device: Walker-rolling Max distance: 150'   Walk 10 feet activity   Assist     Assist level: Independent with assistive device Assistive device: Walker-rolling,Orthosis   Walk 50 feet activity   Assist    Assist level: Independent with assistive device Assistive device: Walker-rolling,Orthosis    Walk 150 feet activity   Assist Walk 150 feet activity did not occur: Safety/medical concerns  Assist level: Independent  with assistive device Assistive device: Walker-rolling,Orthosis    Walk 10 feet on uneven surface  activity   Assist     Assist level: Supervision/Verbal cueing Assistive device: Walker-rolling,Orthosis   Wheelchair     Assist Will patient use wheelchair at discharge?: Yes Type of Wheelchair: Manual    Wheelchair assist level: Supervision/Verbal cueing Max wheelchair distance: 150    Wheelchair 50 feet with 2 turns  activity    Assist        Assist Level: Supervision/Verbal cueing   Wheelchair 150 feet activity     Assist      Assist Level: Supervision/Verbal cueing    Medical Problem List and Plan: 1.  B/L quad tendon repair secondary to tendon ruptures B/L  secondary to a fall at work, with WBAT/no flexion of knees B/L  DC today  Patient to follow up for hospital follow up in 1 month post-discharge 2.  Impaired mobility: -DVT/anticoagulation:  Pharmaceutical: d/c Lovenox since ambulating >250 feet             -antiplatelet therapy: N/A 3. Pain Management:  Decrease ultram to 50 mg TID scheduled, d/ced oxycodone.   Controlled on 4/22 4. Mood: LCSW to follow for evaluation and support.              -antipsychotic agents: N/A 5. Neuropsych: This patient is capable of making decisions on his own behalf. 6. B/l patellar incisions: healing well, continue to monitor.   7. Fluids/Electrolytes/Nutrition: Monitor I/Os 8. HTN: Monitor BP TID--decreased avapro to 75mg   Controlled on 4/22  Monitor with increased mobility 9. GERD: Continue Protonix. 10. Leucocytosis: Resolved  Afebrile  Monitor for fevers and signs of infection. 11. New diagnosis T2DM: has been pre-diabetic. Now with Hgb A1C-7.1.     CBG (last 3)  Recent Labs    06/05/20 1643 06/05/20 2109 06/06/20 0602  GLUCAP 87 123* 104*   Relatively controlled on 4/22 12. ABLA:  Resolved  Hemoglobin 14.1 on 4/20  Continue to monitor 13. Hyponatremia:   Sodium 137 on 4/21  Continue to monitor 14. Hypoalbuminemia:  Protein levels trending down. Encourage high protein foods.  15.  Bowels-  Initially having loose stools, so decreased senokot S to 1 tab nightly from 2 tabs nightly  Improving 15.  Mild transaminitis: Resolved 16. Obesity BMI 32.49: continue dietary education  > 30 minutes spent in total in discharge planning between myself and PA regarding aforementioned, as well discussion regarding DME equipment, follow-up  appointments, follow-up therapies, discharge medications, discharge recommendations, answering questions.  Please see discharge summary as well.  LOS: 15 days A FACE TO FACE EVALUATION WAS PERFORMED  Jamille Fisher 5/21 06/06/2020, 12:22 PM

## 2020-06-06 NOTE — Progress Notes (Addendum)
Patient ID: Arthur Ayers, male   DOB: 05-07-1965, 55 y.o.   MRN: [id number] Spoke with [employer]. She now informs worker the ramp will not be placed until Monday. Have asked if they can ambulance pt home and have ramp put up on Monday. She will need to call the adjuster to get approval. Will await return call  10:15 Cindi called back adjuster has approved ambualnce transport home. Pt and wife aware and will set up PTAR at 12:00

## 2020-07-03 ENCOUNTER — Other Ambulatory Visit: Payer: Self-pay | Admitting: Physical Medicine and Rehabilitation

## 2020-07-10 ENCOUNTER — Encounter
Payer: No Typology Code available for payment source | Attending: Physical Medicine and Rehabilitation | Admitting: Physical Medicine and Rehabilitation

## 2020-07-10 ENCOUNTER — Encounter: Payer: Self-pay | Admitting: Physical Medicine and Rehabilitation

## 2020-07-10 ENCOUNTER — Other Ambulatory Visit: Payer: Self-pay

## 2020-07-10 VITALS — BP 117/80 | HR 72 | Temp 98.3°F | Ht 69.0 in | Wt 216.8 lb

## 2020-07-10 DIAGNOSIS — M25569 Pain in unspecified knee: Secondary | ICD-10-CM

## 2020-07-10 DIAGNOSIS — Z79891 Long term (current) use of opiate analgesic: Secondary | ICD-10-CM | POA: Diagnosis not present

## 2020-07-10 DIAGNOSIS — G894 Chronic pain syndrome: Secondary | ICD-10-CM | POA: Diagnosis not present

## 2020-07-10 DIAGNOSIS — S86819S Strain of other muscle(s) and tendon(s) at lower leg level, unspecified leg, sequela: Secondary | ICD-10-CM

## 2020-07-10 DIAGNOSIS — Z5181 Encounter for therapeutic drug level monitoring: Secondary | ICD-10-CM | POA: Diagnosis present

## 2020-07-10 DIAGNOSIS — G8918 Other acute postprocedural pain: Secondary | ICD-10-CM | POA: Diagnosis present

## 2020-07-10 NOTE — Patient Instructions (Addendum)
Antiinflammatory foods: 1) Ginger (especially studied for arthritis)- reduce leukotriene production to decrease inflammation 2) Blueberries- high in phytonutrients that decrease inflammation 3) Salmon- marine omega-3s reduce joint swelling and pain 4) Pumpkin seeds- reduce inflammation 5) dark chocolate- reduces inflammation 6) turmeric- reduces inflammation 7) tart cherries - reduce pain and stiffness 8) extra virgin olive oil - its compound olecanthal helps to block prostaglandins  9) chili peppers- can be eaten or applied topically via capsaicin 10) mint- helpful for headache, muscle aches, joint pain, and itching 11) garlic- reduces inflammation  Link to further information on diet for chronic pain: [email or web address]  HTN: -Advised checking BP daily at home and logging results to bring into follow-up appointment with her PCP and myself. -Reviewed BP meds today.  -Advised regarding healthy foods that can help lower blood pressure and provided with a list: 1) citrus foods- high in vitamins and minerals 2) salmon and other fatty fish - reduces inflammation and oxylipins 3) swiss chard (leafy green)- high level of nitrates 4) pumpkin seeds- one of the best natural sources of magnesium 5) Beans and lentils- high in fiber, magnesium, and potassium 6) Berries- high in flavonoids 7) Amaranth (whole grain, can be cooked similarly to rice and oats)- high in magnesium and fiber 8) Pistachios- even more effective at reducing BP than other nuts 9) Carrots- high in phenolic compounds that relax blood vessels and reduce inflammation 10) Celery- contain phthalides that relax tissues of arterial walls 11) Tomatoes- can also improve cholesterol and reduce risk of heart disease 12) Broccoli- good source of magnesium, calcium, and potassium 13) Greek yogurt: high in potassium and calcium 14) Herbs and spices: Celery seed,  cilantro, saffron, lemongrass, black cumin, ginseng, cinnamon, cardamom, sweet basil, and ginger 15) Chia and flax seeds- also help to lower cholesterol and blood sugar 16) Beets- high levels of nitrates that relax blood vessels  17) spinach and bananas- high in potassium

## 2020-07-10 NOTE — Progress Notes (Signed)
Subjective:    Patient ID: Arthur Ayers, male    DOB: February 08, 1966, 55 y.o.   MRN: [id number]  HPI  Arthur Ayers is a 55 year old man who presents for hospital f/u after [facility] admission for patellar tendon rupture.  He lives in [place].  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 And then 1 month more until full time.  Outpatient therapy is going really well.  At home he rarely needs the rolling walker.  He saw Dr. Kristen Loader and his surgeon.  He has been using the Tramadol once in while He is not having constipation any more. F/u is scheduled in 4 weeks with ortho   Pain Inventory Average Pain 1 Pain Right Now 0 My pain is aching  In the last 24 hours, has pain interfered with the following? General activity 1 Relation with others 0 Enjoyment of life 0 What TIME of day is your pain at its worst? evening Sleep (in general) NA  Pain is worse with: bending Pain improves with: rest, heat/ice, therapy/exercise and medication Relief from Meds: 10  use a walker do you drive?  no  employed # of hrs/week prior to hospital I need assistance with the following:  meal prep, household duties and shopping  trouble walking spasms  Any changes since last visit?  no  Any changes since last visit?  no    Family History  Problem Relation Age of Onset  . Diabetes Mother   . Thyroid disease Sister    Social History   Socioeconomic History  . Marital status: Married    Spouse name: Arthur Ayers  . Number of children: 2  . Years of education: Not on file  . Highest education level: Not on file  Occupational History  . Not on file  Tobacco Use  . Smoking status: Never Smoker  . Smokeless tobacco: Never Used  Vaping Use  . Vaping Use: Never used  Substance and Sexual Activity  . Alcohol use: Yes    Comment: Occasionally  . Drug use: Never  . Sexual activity: Yes  Other Topics Concern  . Not on file  Social History Narrative   . Not on file   Social Determinants of Health   Financial Resource Strain: Not on file  Food Insecurity: Not on file  Transportation Needs: Not on file  Physical Activity: Not on file  Stress: Not on file  Social Connections: Not on file   Past Surgical History:  Procedure Laterality Date  . QUADRICEPS TENDON REPAIR Bilateral 05/13/2020   Procedure: REPAIR QUADRICEP TENDON;  Surgeon: Sheral Apley, MD;  Location: [facility] OR;  Service: Orthopedics;  Laterality: Bilateral;  . SHOULDER SURGERY     Rotator Cuff Repair in 2015 R shoulder  . TONSILLECTOMY     Past Medical History:  Diagnosis Date  . Acute pain of right knee 05/13/2020  . GERD (gastroesophageal reflux disease)   . Hypercholesteremia   . Hypertension    BP 117/80   Pulse 72   Temp 98.3 F (36.8 C)   Ht 5\' 9"  (1.753 m)   Wt 216 lb 12.8 oz (98.3 kg)   SpO2 96%   BMI 32.02 kg/m   Opioid Risk Score:   Fall Risk Score:  `1  Depression screen PHQ 2/9  Depression screen PHQ 2/9 07/10/2020  Decreased Interest 0  Down, Depressed, Hopeless 0  PHQ - 2 Score 0  Altered sleeping 0  Tired,  decreased energy 0  Change in appetite 0  Feeling bad or failure about yourself  0  Trouble concentrating 0  Moving slowly or fidgety/restless 0  PHQ-9 Score 0   Review of Systems  Constitutional: Negative.   HENT: Negative.   Eyes: Negative.   Respiratory: Negative.   Cardiovascular: Negative.   Gastrointestinal: Negative.   Musculoskeletal: Positive for gait problem.       Occasional spasms  Skin: Negative.   Allergic/Immunologic: Negative.   Hematological: Negative.   Psychiatric/Behavioral: Negative.   All other systems reviewed and are negative.      Objective:   Physical Exam  Gen: no distress, normal appearing HEENT: oral mucosa pink and moist, NCAT Cardio: Reg rate Chest: normal effort, normal rate of breathing Abd: soft, non-distended Ext: no edema Psych: pleasant, normal affect Skin: intact Neuro:  Alert and oriented x3 Musculoskeletal: Bilateral lower extremities in braces    Assessment & Plan:  1) Patellar tendon rupture - continue to follow with ortho - continue outpatient therapy.   2) Post-operative pain -continue tramadol PRN -pain contract and urine sample obtained today.  -Discussed following foods that may reduce pain: 1) Ginger (especially studied for arthritis)- reduce leukotriene production to decrease inflammation 2) Blueberries- high in phytonutrients that decrease inflammation 3) Salmon- marine omega-3s reduce joint swelling and pain 4) Pumpkin seeds- reduce inflammation 5) dark chocolate- reduces inflammation 6) turmeric- reduces inflammation 7) tart cherries - reduce pain and stiffness 8) extra virgin olive oil - its compound olecanthal helps to block prostaglandins  9) chili peppers- can be eaten or applied topically via capsaicin 10) mint- helpful for headache, muscle aches, joint pain, and itching 11) garlic- reduces inflammation  Link to further information on diet for chronic pain: [email or web address]   3) HTN: -BP is very well controlled today.  -Advised checking BP daily at home and logging results to bring into follow-up appointment with her PCP and myself. -Reviewed BP meds today, continue and can consider weaning in future if continues to be well controlled.  -Advised regarding healthy foods that can help lower blood pressure and provided with a list: 1) citrus foods- high in vitamins and minerals 2) salmon and other fatty fish - reduces inflammation and oxylipins 3) swiss chard (leafy green)- high level of nitrates 4) pumpkin seeds- one of the best natural sources of magnesium 5) Beans and lentils- high in fiber, magnesium, and potassium 6) Berries- high in flavonoids 7) Amaranth (whole grain, can be cooked similarly to rice and oats)- high in magnesium and fiber 8)  Pistachios- even more effective at reducing BP than other nuts 9) Carrots- high in phenolic compounds that relax blood vessels and reduce inflammation 10) Celery- contain phthalides that relax tissues of arterial walls 11) Tomatoes- can also improve cholesterol and reduce risk of heart disease 12) Broccoli- good source of magnesium, calcium, and potassium 13) Greek yogurt: high in potassium and calcium 14) Herbs and spices: Celery seed, cilantro, saffron, lemongrass, black cumin, ginseng, cinnamon, cardamom, sweet basil, and ginger 15) Chia and flax seeds- also help to lower cholesterol and blood sugar 16) Beets- high levels of nitrates that relax blood vessels  17) spinach and bananas- high in potassium

## 2020-07-17 ENCOUNTER — Other Ambulatory Visit: Payer: Self-pay | Admitting: Physical Medicine and Rehabilitation

## 2020-07-17 LAB — TOXASSURE SELECT,+ANTIDEPR,UR

## 2020-07-24 ENCOUNTER — Telehealth: Payer: Self-pay | Admitting: *Deleted

## 2020-07-24 NOTE — Telephone Encounter (Signed)
Urine drug screen for this encounter is consistent for prescribed medication 

## 2020-10-17 ENCOUNTER — Encounter: Payer: Worker's Compensation | Admitting: Physical Medicine and Rehabilitation

## 2022-11-17 IMAGING — DX DG KNEE 1-2V*R*
2 series · 2 of 2 positions shown · non-contrast
Comparison: None.

CLINICAL DATA: Right knee pain after fall.

EXAM:
RIGHT KNEE - 1-2 VIEW

[knee ap]
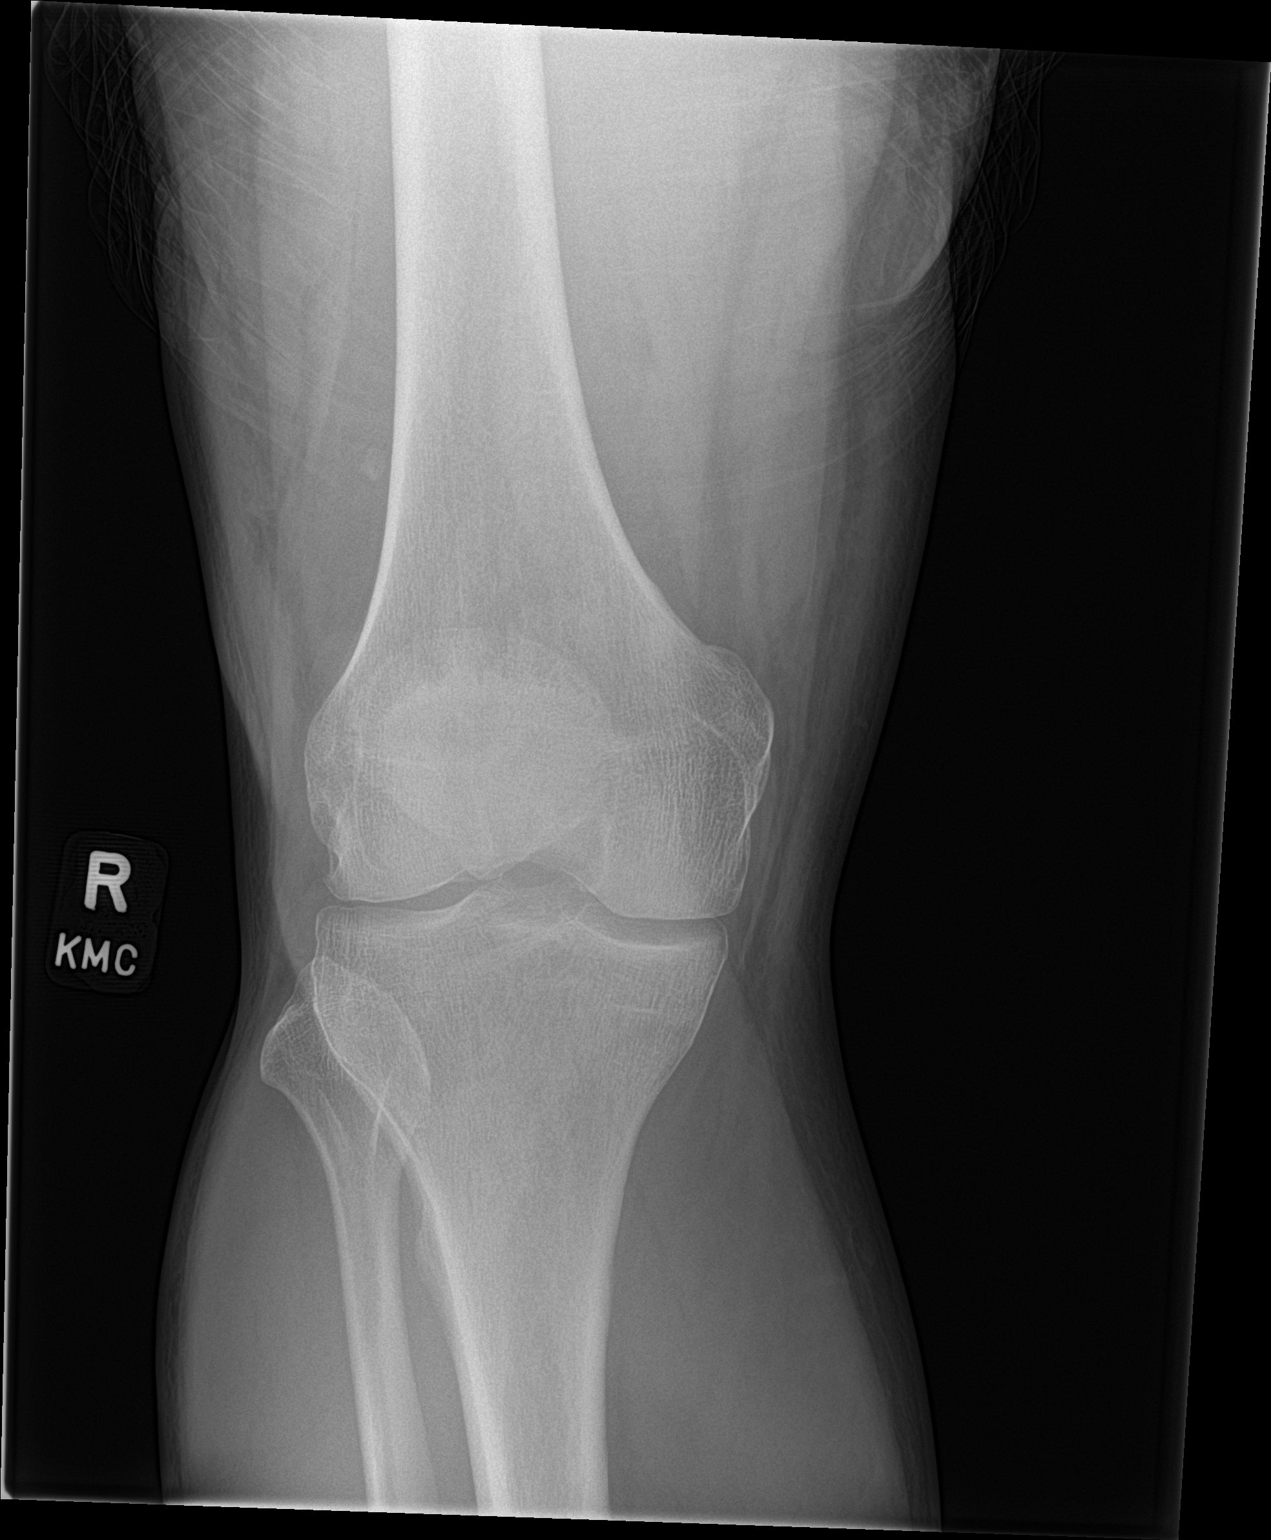

[knee lat]
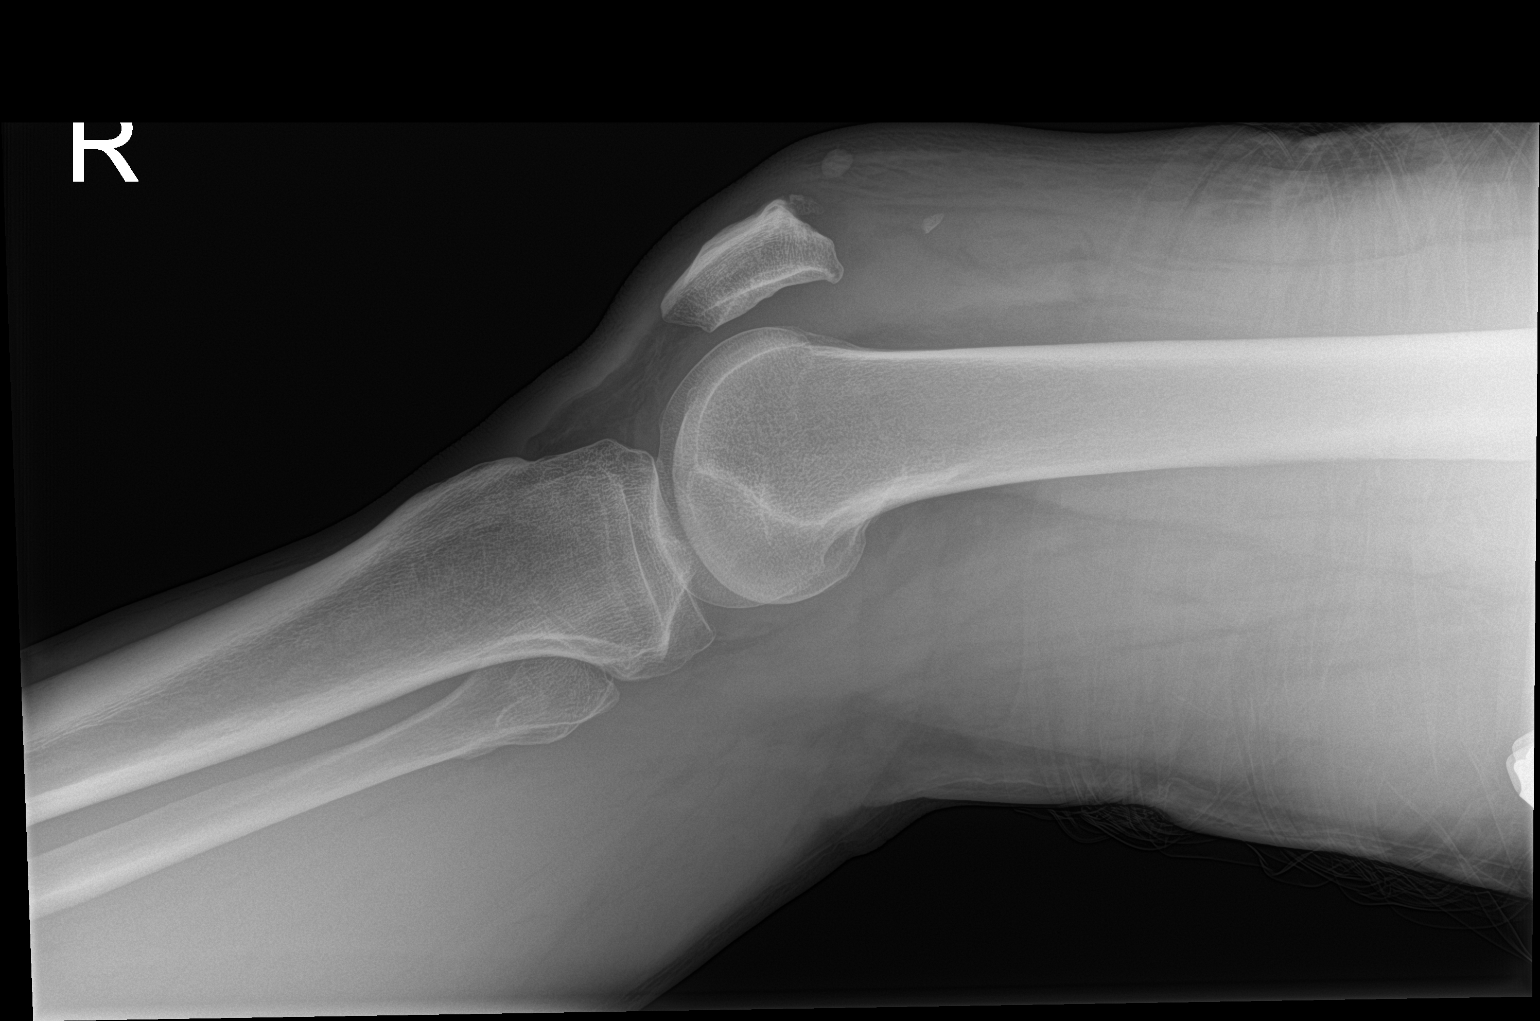

[2 of 2 positions shown; findings below may reference images not displayed]

FINDINGS: Mild patellar [place]. Large amount of soft tissue edema in the
suprapatellar region. There is a 6 mm osseous fragment displaced
cm proximal to the superior patella. Joint effusion. No fracture of
the femur, proximal tibia or fibula. Tibiofemoral alignment is
maintained.
IMPRESSION: 1. Displaced 6 mm osseous fragment adjacent to the superior patella
with associated soft tissue edema and mild patellar B[place]. Findings
are suggestive of quadriceps tendon injury/rupture.
2. Joint effusion.

## 2022-11-18 IMAGING — MR MR KNEE*R* W/O CM
8 series · 38 of 40 positions shown · non-contrast
Comparison: None.

CLINICAL DATA: Injury after getting after getting out of [employer] track

EXAM:
MRI OF THE RIGHT KNEE WITHOUT CONTRAST
TECHNIQUE: Multiplanar, multisequence MR imaging of the knee was performed. No
intravenous contrast was administered.

[Series 8: T2 fat-sat · axial · right · 4.0mm · 0.36mm/px · z∈[-89,+59]mm · 5 of 31 slices shown (1 of 4)]
[im 1/31]
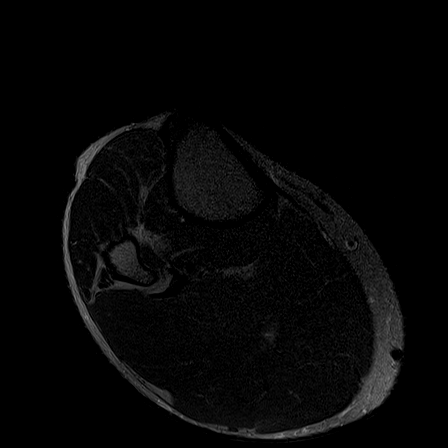
[im 8/31]
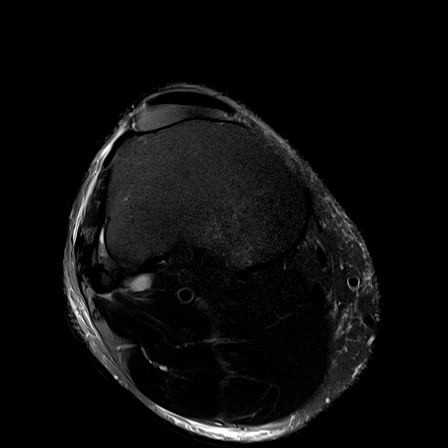
[im 16/31]
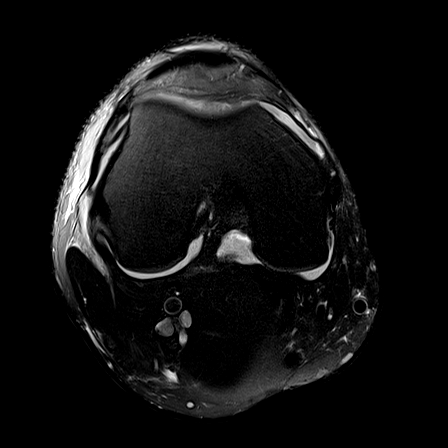
[im 23/31]
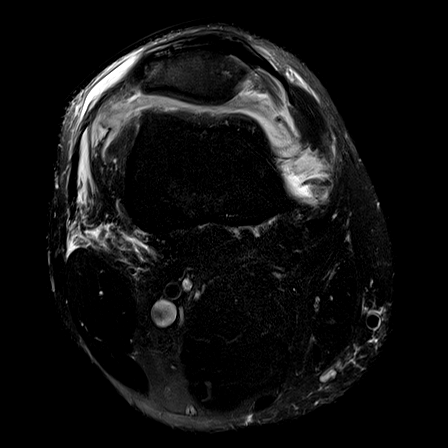
[im 31/31]
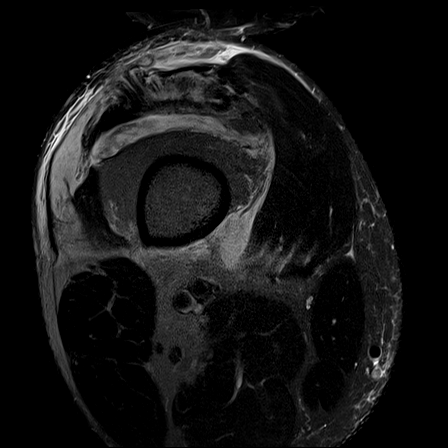

[Series 9: T1 · coronal · right · 4.0mm · 0.49mm/px · 3 of 30 slices shown]
[im 1/30]
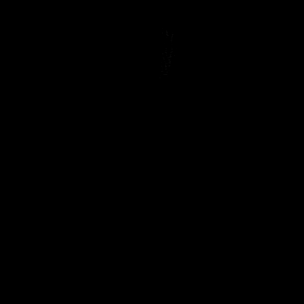
[im 8/30]
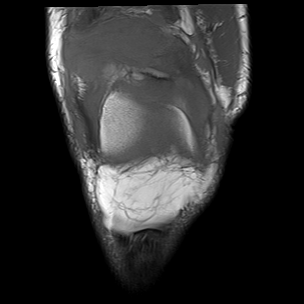
[im 15/30]
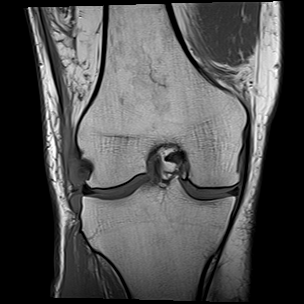

[Series 10: T2 fat-sat · coronal · right · 4.0mm · 0.59mm/px · 5 of 30 slices shown (2 of 4)]
[im 1/30]
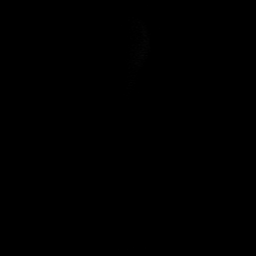
[im 8/30]
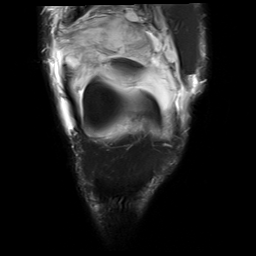
[im 15/30]
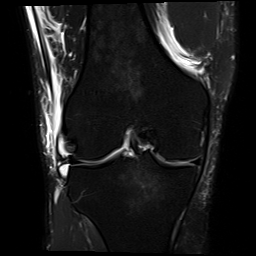
[im 22/30]
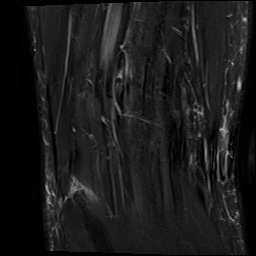
[im 30/30]
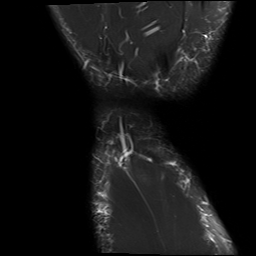

[Series 11: PD fat-sat · coronal · right · 3.0mm · 0.44mm/px · 6 of 34 slices shown (1 of 2)]
[im 1/34]
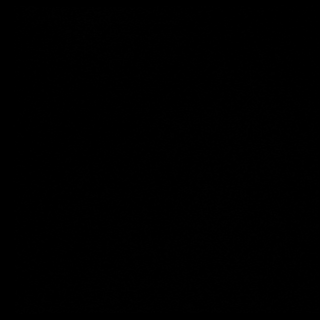
[im 7/34]
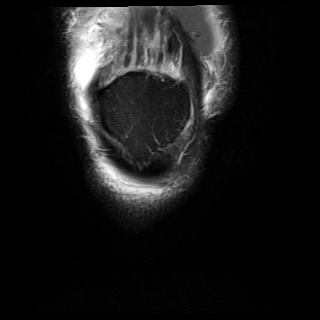
[im 14/34]
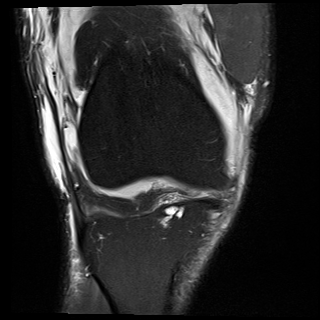
[im 20/34]
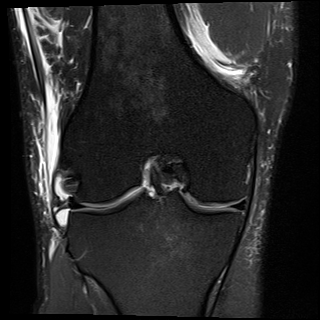
[im 27/34]
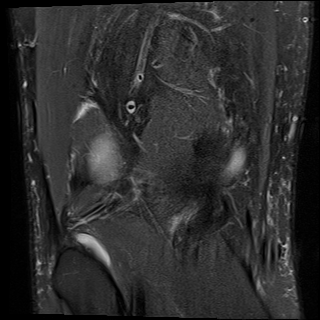
[im 34/34]
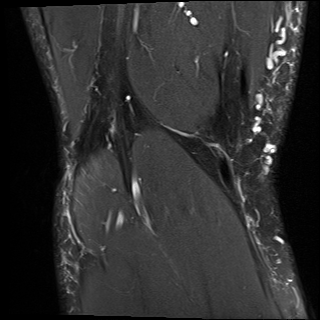

[Series 12: PD fat-sat · sagittal · right · 3.0mm · 0.52mm/px · 6 of 36 slices shown (2 of 2)]
[im 1/36]
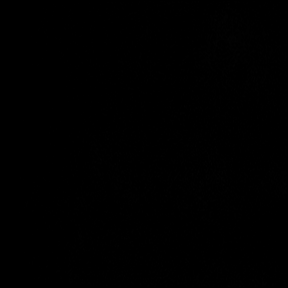
[im 8/36]
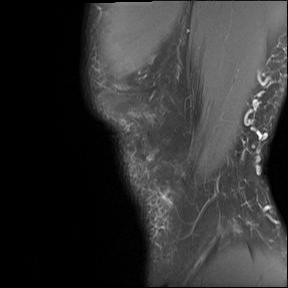
[im 15/36]
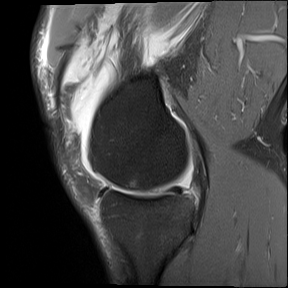
[im 22/36]
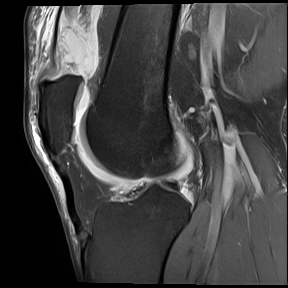
[im 29/36]
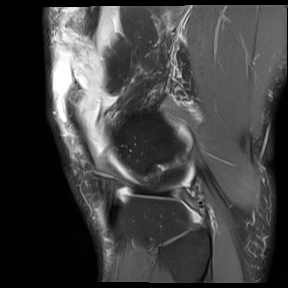
[im 36/36]
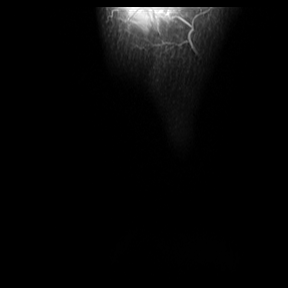

[Series 13: T2 fat-sat · sagittal · right · 3.0mm · 0.52mm/px · 6 of 36 slices shown (3 of 4)]
[im 1/36]
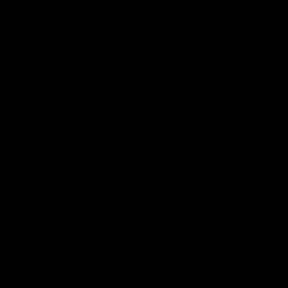
[im 8/36]
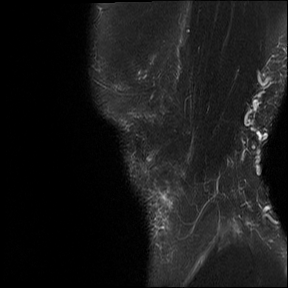
[im 15/36]
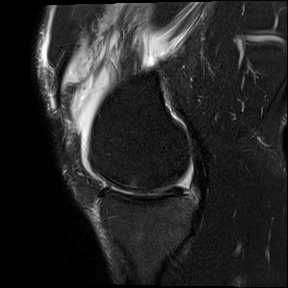
[im 22/36]
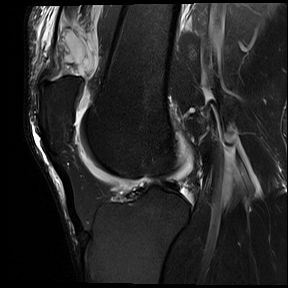
[im 29/36]
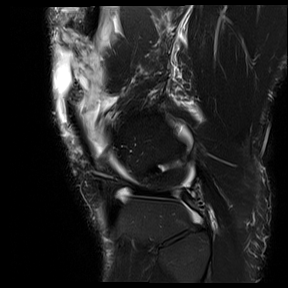
[im 36/36]
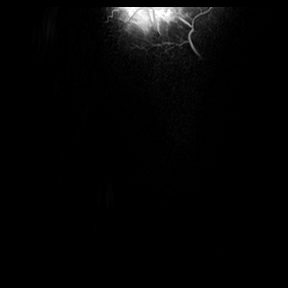

[Series 14: T2 fat-sat · sagittal · right · 3.0mm · 0.33mm/px · 5 of 33 slices shown (4 of 4)]
[im 1/33]
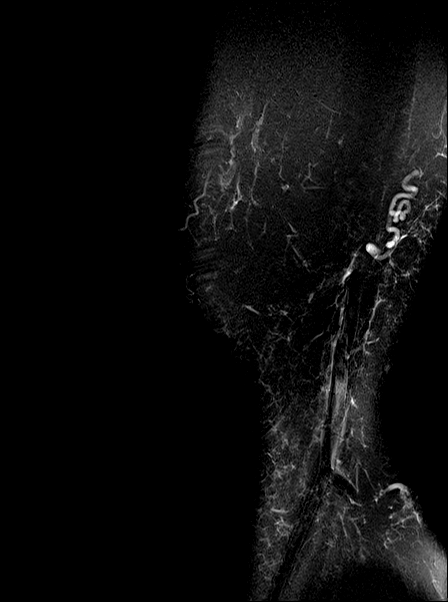
[im 9/33]
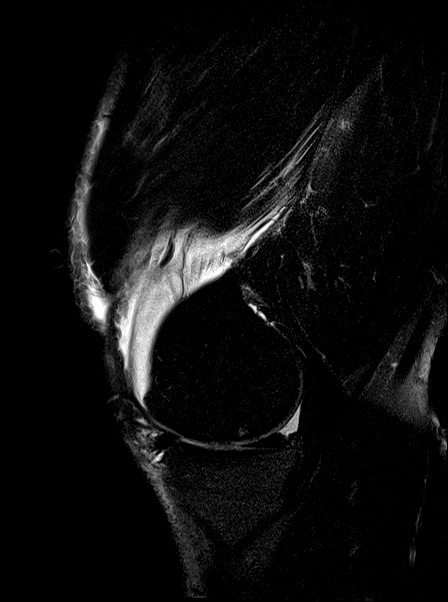
[im 17/33]
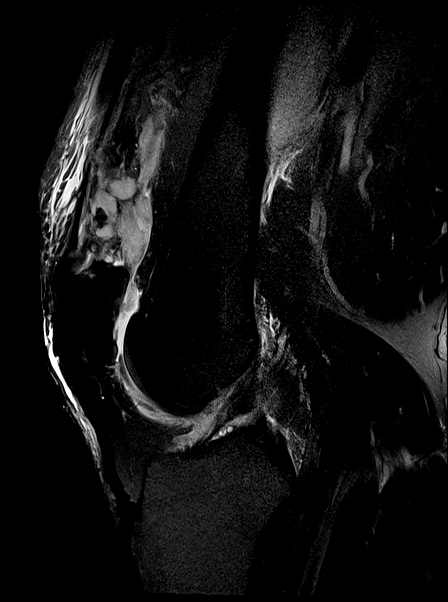
[im 25/33]
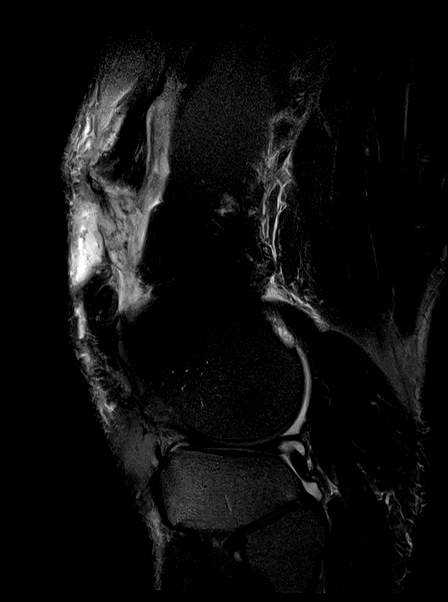
[im 33/33]
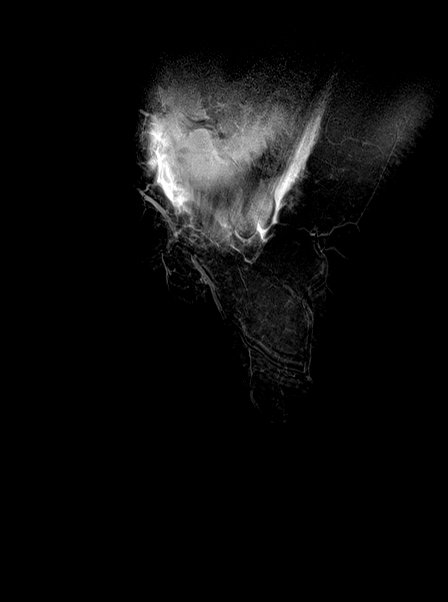

[Series 15: PD · coronal · right · 2.0mm · 0.47mm/px · 2 of 12 slices shown]
[im 1/12]
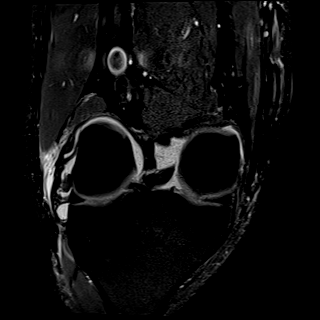
[im 12/12]
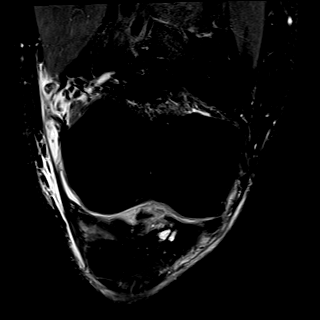

[38 of 40 positions shown; findings below may reference images not displayed]

FINDINGS: MENISCI

Medial: Intact.

Lateral: Intact.

LIGAMENTS

Cruciates: The ACL is intact. The PCL is intact.

Collaterals: Mildly increased signal seen around the medial
collateral ligament. There is also increased signal seen within the
biceps femoris and fibular collateral ligaments.

CARTILAGE

Patellofemoral: Chondral fissuring seen within the central patellar
apex and lateral patellar facet.

Medial compartment: Chondral fissuring with subchondral cystic
changes seen in the weight-bearing surface of the medial femoral
condyle.

Lateral compartment: Normal.

BONES: Increased signal seen in the posterior medial tibial plateau.
Enthesophytes seen at the superior patellar pole and inferior
patellar poles.

JOINT: A large knee joint effusion with scattered debris and mixed
hematoma seen extending anteriorly.

EXTENSOR MECHANISM: A high-grade tear of the lateral quadriceps
tendon is noted with only a small portion of tendon remaining. There
is fluid seen extending to the prepatellar space. Increased signal
seen within the lateral patellar retinaculum, however it is intact.

POPLITEAL FOSSA: No popliteal cyst.

OTHER: Prepatellar subcutaneous edema and fluid with hematoma. There
is vastus medialis and lateralis muscular edema.
IMPRESSION: 1. High-grade partial-thickness tear of the quadriceps tendon
predominantly within the lateral aspect. There does appear to be a
small portion of tendon remaining.
2. Large knee joint effusion extending to the prepatellar space with
hematoma and debris
3. Intact menisci and cruciate ligaments
Intrasubstance sprain of the lateral collateral ligament and lateral
patellar retinaculum
4. Medial and patellofemoral compartment chondral disease
5. Osseous contusion the posterior medial tibial plateau. No osseous
fracture.
